# Patient Record
Sex: Female | Born: 1995 | Race: White | Hispanic: No | Marital: Single | Smoking: Never smoker
Health system: Southern US, Community
[De-identification: ages and names within clinical notes are randomized; demographics above are authoritative.]

## PROBLEM LIST (undated history)

## (undated) DIAGNOSIS — F909 Attention-deficit hyperactivity disorder, unspecified type: Secondary | ICD-10-CM

## (undated) HISTORY — PX: NO PAST SURGERIES: SHX2092

## (undated) HISTORY — DX: Attention-deficit hyperactivity disorder, unspecified type: F90.9

---
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
## 2017-03-31 ENCOUNTER — Encounter: Payer: Self-pay | Admitting: Family Medicine

## 2017-03-31 ENCOUNTER — Ambulatory Visit (INDEPENDENT_AMBULATORY_CARE_PROVIDER_SITE_OTHER): Payer: PRIVATE HEALTH INSURANCE | Admitting: Family Medicine

## 2017-03-31 DIAGNOSIS — F909 Attention-deficit hyperactivity disorder, unspecified type: Secondary | ICD-10-CM | POA: Diagnosis not present

## 2017-03-31 MED ORDER — AMPHETAMINE-DEXTROAMPHET ER 15 MG PO CP24: 15.0000 mg | ORAL_CAPSULE | ORAL | 0 refills | Status: DC

## 2017-03-31 MED ORDER — AMPHETAMINE-DEXTROAMPHETAMINE 5 MG PO TABS: 5.0000 mg | ORAL_TABLET | Freq: Every day | ORAL | 0 refills | Status: DC

## 2017-03-31 NOTE — Assessment & Plan Note (Signed)
Stable. Krista Mcdonald database reviewed. Refilled Adderall today.

## 2017-03-31 NOTE — Progress Notes (Signed)
   Subjective:  Patient ID: Krista Mcdonald Mcdonald, female    DOB: 09/26/1995  Age: 21 y.o. MRN: [id number]  CC: ADHD  HPI:  21 year old female with ADHD presents for follow-up regarding this.  ADHD  Doing well on Adderall (uses intermittently).  In see database reviewed. No discrepancies.  She has done well in school this past semester.  She is in need of refill today.  States that her Adderall has been working well. No side effects. No weight loss. Normal appetite.  Social Hx   Social History   Social History  . Marital status: Single    Spouse name: N/A  . Number of children: N/A  . Years of education: N/A   Social History Main Topics  . Smoking status: Never Smoker  . Smokeless tobacco: Never Used  . Alcohol use No  . Drug use: No  . Sexual activity: No   Other Topics Concern  . None   Social History Narrative   12th grade [employer]     Review of Systems  Constitutional: Negative.   Psychiatric/Behavioral: Positive for decreased concentration.   Objective:  BP 114/80   Pulse 83   Temp 98.3 F (36.8 C) (Oral)   Wt 105 lb 12.8 oz (48 kg)   LMP 03/28/2017 (Approximate)   SpO2 98%   BMI 18.16 kg/m   BP/Weight 03/31/2017 08/04/2016 03/26/2016  Systolic BP 114 94 108  Diastolic BP 80 61 62  Wt. (Lbs) 105.8 107 106.5  BMI 18.16 18.37 18.57   Physical Exam  Constitutional: She is oriented to person, place, and time. She appears well-developed. No distress.  Cardiovascular: Normal rate and regular rhythm.   Pulmonary/Chest: Effort normal. She has no wheezes. She has no rales.  Neurological: She is alert and oriented to person, place, and time.  Psychiatric: She has a normal mood and affect.  Vitals reviewed.  Assessment & Plan:   Problem List Items Addressed This Visit      Other   Attention deficit hyperactivity disorder (ADHD)    Stable. [name] database reviewed. Refilled Adderall today.         Meds ordered this encounter  Medications    . amphetamine-dextroamphetamine (ADDERALL XR) 15 MG 24 hr capsule    Sig: Take 1 capsule by mouth every morning.    Dispense:  90 capsule    Refill:  0  . amphetamine-dextroamphetamine (ADDERALL) 5 MG tablet    Sig: Take 1 tablet (5 mg total) by mouth daily.    Dispense:  90 tablet    Refill:  0     Follow-up: PRN  Krista Mcdonald OtherJayce [name] DO [facility] [place]

## 2017-03-31 NOTE — Patient Instructions (Signed)
Continue your meds.  Call for refills.   Take care  Dr. Adriana Simasook

## 2017-04-22 ENCOUNTER — Telehealth: Payer: Self-pay | Admitting: Family Medicine

## 2017-05-12 ENCOUNTER — Telehealth: Payer: Self-pay | Admitting: Family Medicine

## 2017-05-12 NOTE — Telephone Encounter (Signed)
Patient and patients mom have been notified that immunization records have been placed up front for pick up.

## 2017-05-12 NOTE — Telephone Encounter (Signed)
Pt called wanting to get a copy of her Immunization record. Thank you!  Call pt @ [phone]. Thank you!

## 2017-05-17 ENCOUNTER — Ambulatory Visit: Payer: PRIVATE HEALTH INSURANCE

## 2017-06-21 ENCOUNTER — Telehealth: Payer: Self-pay | Admitting: *Deleted

## 2017-06-21 MED ORDER — AMPHETAMINE-DEXTROAMPHETAMINE 5 MG PO TABS: 5.0000 mg | ORAL_TABLET | Freq: Every day | ORAL | 0 refills | Status: DC

## 2017-06-21 NOTE — Telephone Encounter (Signed)
Please advise 

## 2017-06-21 NOTE — Telephone Encounter (Signed)
vm full.

## 2017-06-21 NOTE — Telephone Encounter (Signed)
Pt has requested to have a medication refill Adderall  Pt requested to Establish with Dr.Sonnenberg  Pt contact [phone]

## 2017-06-21 NOTE — Telephone Encounter (Signed)
It appears the patient has 2 Adderall prescriptions. Please see what her regimen is and then I will refill.

## 2017-06-21 NOTE — Telephone Encounter (Signed)
Left message to return call 

## 2017-06-21 NOTE — Telephone Encounter (Signed)
Refilled. Patient needs an appointment to establish care.

## 2017-06-21 NOTE — Telephone Encounter (Signed)
Pt called back and stated that she does use both doses. Pt states that she just needs to 5 mg refilled. Please advise, thank you!

## 2017-06-22 NOTE — Telephone Encounter (Signed)
Patient scheduled.

## 2017-06-30 ENCOUNTER — Encounter: Payer: Self-pay | Admitting: Family Medicine

## 2017-06-30 ENCOUNTER — Ambulatory Visit (INDEPENDENT_AMBULATORY_CARE_PROVIDER_SITE_OTHER): Payer: PRIVATE HEALTH INSURANCE | Admitting: Family Medicine

## 2017-06-30 DIAGNOSIS — F909 Attention-deficit hyperactivity disorder, unspecified type: Secondary | ICD-10-CM | POA: Diagnosis not present

## 2017-06-30 DIAGNOSIS — N946 Dysmenorrhea, unspecified: Secondary | ICD-10-CM | POA: Diagnosis not present

## 2017-06-30 MED ORDER — AMPHETAMINE-DEXTROAMPHET ER 15 MG PO CP24
15.0000 mg | ORAL_CAPSULE | ORAL | 0 refills | Status: DC
Start: 2017-06-30 — End: 2018-02-21

## 2017-06-30 MED ORDER — LEVONORGEST-ETH ESTRAD 91-DAY 0.15-0.03 MG PO TABS: 1.0000 | ORAL_TABLET | Freq: Every day | ORAL | 4 refills | Status: DC

## 2017-06-30 MED ORDER — AMPHETAMINE-DEXTROAMPHETAMINE 5 MG PO TABS: 5.0000 mg | ORAL_TABLET | Freq: Every day | ORAL | 0 refills | Status: DC

## 2017-06-30 NOTE — Patient Instructions (Signed)
Nice to meet you. We will start you on continuous birth control. You should start this on the first Sunday after your menstrual cycle starts.

## 2017-06-30 NOTE — Assessment & Plan Note (Signed)
Patient's history consistent with dysmenorrhea. Cramping only occurs when she is on her menstrual cycle. No other abdominal pain. No other symptoms. Discussed options for treatment including ibuprofen which she has already tried with only mild benefit. Discussed oral contraceptives, Depo-Provera, Implanon, and IUD. Patient opted for oral contraceptives. Discussed monthly versus continuous use. Patient opted for continuous use. Prescription as outlined below. If not improving she will let us know. I did discuss obtaining a urine pregnancy test though she deferred given that she is on her menstrual cycle currently.

## 2017-06-30 NOTE — Assessment & Plan Note (Signed)
Symptoms fairly well controlled with current regimen. Refills will be given.

## 2017-06-30 NOTE — Progress Notes (Signed)
Krista AlarEric Drago Hammonds, MD Phone: [phone]  [place] Nation[place]and[place] M Mcdonald is a 21 y.o. female who presents today for follow-up.  ADHD: Taking Adderall XR 15 mg daily. Also taking 5-10 mg of immediate release at night to help with studying. [place] [employer] [place] herself. Has trouble focusing when she is at her sorority house. No appetite changes. No weight changes. No palpitations.  She wants to talk about options for primary dysmenorrhea. She notes for some time now she has had fairly significant cramps with menstruation. She does have a menstrual cycle once monthly. Lasts for 5 days. Currently on her menstrual cycle. She notes no urinary symptoms. No discharge. The cramps occur midline suprapubically. Advil does help sometimes though not all the time. She denies sexual activity. Does not plan to become sexually active anytime soon. Nonsmoker. No personal or family history of blood clot. No personal or family history of clotting disorder.  PMH: nonsmoker.   ROS see history of present illness  Objective  Physical Exam Vitals:   06/30/17 0829  BP: 90/60  Pulse: 74  Resp: 16  Temp: 98.4 F (36.9 C)  SpO2: 99%    BP Readings from Last 3 Encounters:  06/30/17 90/60  03/31/17 114/80  08/04/16 94/61   Wt Readings from Last 3 Encounters:  06/30/17 103 lb 8 oz (46.9 kg) (7 %, Z= -1.51)*  03/31/17 105 lb 12.8 oz (48 kg) (10 %, Z= -1.31)*  08/04/16 107 lb (48.5 kg) (13 %, Z= -1.14)*   * Growth percentiles are based on CDC 2-20 Years data.    Physical Exam  Constitutional: No distress.  Cardiovascular: Normal rate, regular rhythm and normal heart sounds.   Pulmonary/Chest: Effort normal and breath sounds normal.  Abdominal: Soft. Bowel sounds are normal. She exhibits no distension. There is no tenderness. There is no rebound and no guarding.  Skin: Skin is warm and dry. She is not diaphoretic.     Assessment/Plan: Please see individual problem list.  Attention deficit hyperactivity  disorder (ADHD) Symptoms fairly well controlled with current regimen. Refills will be given.  Dysmenorrhea Patient's history consistent with dysmenorrhea. Cramping only occurs when she is on her menstrual cycle. No other abdominal pain. No other symptoms. Discussed options for treatment including ibuprofen which she has already tried with only mild benefit. Discussed oral contraceptives, Depo-Provera, Implanon, and IUD. Patient opted for oral contraceptives. Discussed monthly versus continuous use. Patient opted for continuous use. Prescription as outlined below. If not improving she will let us know. I did discuss obtaining a urine pregnancy test though she deferred given that she is on her menstrual cycle currently.   No orders of the defined types were placed in this encounter.   Meds ordered this encounter  Medications  . levonorgestrel-ethinyl estradiol (SEASONALE,INTROVALE,JOLESSA) 0.15-0.03 MG tablet    Sig: Take 1 tablet by mouth daily.    Dispense:  1 Package    Refill:  4  . amphetamine-dextroamphetamine (ADDERALL XR) 15 MG 24 hr capsule    Sig: Take 1 capsule by mouth every morning.    Dispense:  90 capsule    Refill:  0  . amphetamine-dextroamphetamine (ADDERALL) 5 MG tablet    Sig: Take 1-2 tablets (5-10 mg total) by mouth daily.    Dispense:  90 tablet    Refill:  0  . amphetamine-dextroamphetamine (ADDERALL) 5 MG tablet    Sig: Take 1-2 tablets (5-10 mg total) by mouth daily. Do not fill until 07/31/17    Dispense:  60 tablet    Refill:  0  . amphetamine-dextroamphetamine (ADDERALL) 5 MG tablet    Sig: Take 1-2 tablets (5-10 mg total) by mouth daily. Do not fill until 08/30/17    Dispense:  60 tablet    Refill:  0    Krista Alar, MD [facility]

## 2017-07-01 ENCOUNTER — Ambulatory Visit: Payer: PRIVATE HEALTH INSURANCE | Admitting: Family Medicine

## 2017-07-15 ENCOUNTER — Telehealth: Payer: Self-pay | Admitting: Family Medicine

## 2017-07-28 ENCOUNTER — Ambulatory Visit: Payer: PRIVATE HEALTH INSURANCE | Admitting: Internal Medicine

## 2018-02-21 ENCOUNTER — Other Ambulatory Visit: Payer: Self-pay

## 2018-02-21 MED ORDER — AMPHETAMINE-DEXTROAMPHET ER 15 MG PO CP24: 15.0000 mg | ORAL_CAPSULE | ORAL | 0 refills | Status: DC

## 2018-02-21 NOTE — Telephone Encounter (Signed)
Sent to pharmacy 

## 2018-02-21 NOTE — Telephone Encounter (Signed)
Last OV 06/30/17 last filled 06/30/17 90 0rf

## 2018-02-21 NOTE — Telephone Encounter (Signed)
She needs an appointment scheduled to follow-up to receive refills.

## 2018-02-21 NOTE — Addendum Note (Signed)
Addended by: Inetta FermoHENDRICKS, JESSICA S on: 02/21/2018 09:24 AM   Modules accepted: Orders

## 2018-02-21 NOTE — Telephone Encounter (Signed)
Copied from CRM [phone]. Topic: Quick Communication - Rx Refill/Question >> Feb 21, 2018  8:54 AM Percival SpanishKennedy, Cheryl W wrote: Medication amphetamine-dextroamphetamine (ADDERALL XR) 15 MG 24 hr capsule  Has the patient contacted their pharmacy  no   Preferred Pharmacy   [facility]   Agent: Please be advised that RX refills may take up to 3 business days. We ask that you follow-up with your pharmacy.

## 2018-02-21 NOTE — Telephone Encounter (Signed)
Patient is scheduled   

## 2018-02-21 NOTE — Addendum Note (Signed)
Addended by: Glori LuisSONNENBERG, Orlene Salmons G on: 02/21/2018 05:04 PM   Modules accepted: Orders

## 2018-02-24 ENCOUNTER — Encounter: Payer: Self-pay | Admitting: Family Medicine

## 2018-02-24 ENCOUNTER — Ambulatory Visit (INDEPENDENT_AMBULATORY_CARE_PROVIDER_SITE_OTHER): Payer: PRIVATE HEALTH INSURANCE | Admitting: Family Medicine

## 2018-02-24 DIAGNOSIS — F909 Attention-deficit hyperactivity disorder, unspecified type: Secondary | ICD-10-CM

## 2018-02-24 DIAGNOSIS — N946 Dysmenorrhea, unspecified: Secondary | ICD-10-CM

## 2018-02-24 MED ORDER — AMPHETAMINE-DEXTROAMPHET ER 15 MG PO CP24: 15.0000 mg | ORAL_CAPSULE | ORAL | 0 refills | Status: DC

## 2018-02-24 NOTE — Progress Notes (Signed)
  Krista AlarEric Sonnenberg, MD Phone: [phone]   Krista Mcdonald is a 22 y.o. female who presents today for f/u.  CC: adhd  ADHD Medication: adderall xr in am, adderal in pm for studying Effectiveness: it works well Palpitations: no Sleep difficulty: no Appetite suppression: no  Patient reports her dysmenorrhea improved significantly with her oral contraceptive pill.  She has a menstrual cycle once every 3 months given that she takes it continuously.   Social History   Tobacco Use  Smoking Status Never Smoker  Smokeless Tobacco Never Used     ROS see history of present illness  Objective  Physical Exam Vitals:   02/24/18 1537  BP: 102/80  Pulse: 87  Temp: 98.5 F (36.9 C)  SpO2: 98%    BP Readings from Last 3 Encounters:  02/24/18 102/80  06/30/17 90/60  03/31/17 114/80   Wt Readings from Last 3 Encounters:  02/24/18 102 lb 9.6 oz (46.5 kg)  06/30/17 103 lb 8 oz (46.9 kg) (7 %, Z= -1.51)*  03/31/17 105 lb 12.8 oz (48 kg) (10 %, Z= -1.31)*   * Growth percentiles are based on CDC (Girls, 2-20 Years) data.    Physical Exam  Constitutional: No distress.  Cardiovascular: Normal rate, regular rhythm and normal heart sounds.  Pulmonary/Chest: Effort normal and breath sounds normal.  Neurological: She is alert.  Skin: Skin is warm and dry. She is not diaphoretic.     Assessment/Plan: Please see individual problem list.  Dysmenorrhea Improved with OCP.  She will continue this.  Attention deficit hyperactivity disorder (ADHD) Symptoms well controlled.  Refill of extended release Adderall given.  Plan to refill immediate release Adderall when she is back in school.  No orders of the defined types were placed in this encounter.   Meds ordered this encounter  Medications  . amphetamine-dextroamphetamine (ADDERALL XR) 15 MG 24 hr capsule    Sig: Take 1 capsule by mouth every morning.    Dispense:  90 capsule    Refill:  0     Krista AlarEric Sonnenberg, MD [facility] [place]

## 2018-02-24 NOTE — Assessment & Plan Note (Signed)
Symptoms well controlled.  Refill of extended release Adderall given.  Plan to refill immediate release Adderall when she is back in school.

## 2018-02-24 NOTE — Patient Instructions (Signed)
Nice to see you. I refilled your Adderall.  We will see you back in 6 months.

## 2018-02-24 NOTE — Assessment & Plan Note (Signed)
Improved with OCP.  She will continue this.

## 2018-04-19 DIAGNOSIS — F419 Anxiety disorder, unspecified: Secondary | ICD-10-CM | POA: Diagnosis not present

## 2018-05-29 DIAGNOSIS — L659 Nonscarring hair loss, unspecified: Secondary | ICD-10-CM | POA: Diagnosis not present

## 2018-06-01 DIAGNOSIS — L638 Other alopecia areata: Secondary | ICD-10-CM | POA: Diagnosis not present

## 2018-06-13 DIAGNOSIS — L639 Alopecia areata, unspecified: Secondary | ICD-10-CM | POA: Diagnosis not present

## 2018-06-16 ENCOUNTER — Telehealth: Payer: Self-pay

## 2018-06-16 NOTE — Telephone Encounter (Signed)
Copied from CRM [id number]. Topic: General - Other >> Jun 16, 2018  4:17 PM Elliot Gault wrote: Relation to pt: self  Call back number: [phone]   Reason for call:  Patient would like to schedule a medication follow up appointment to discuss her adderral and birth control. PCP next available appointment is not until 09/27/2018,  patient would like to be seen sooner, please advise patient if possible

## 2018-06-19 NOTE — Telephone Encounter (Signed)
Sent to PCP are you willing to see patient sooner?

## 2018-06-20 NOTE — Telephone Encounter (Signed)
Its okay to schedule her at 430 on a Monday, Wednesday, or Friday.  Thanks.

## 2018-06-21 NOTE — Telephone Encounter (Signed)
Sent to [place] please schedule patient.   Thanks

## 2018-06-23 NOTE — Telephone Encounter (Signed)
I spoke with pt. Pt is scheduled on 06/30/2018 @ 4:30pm.

## 2018-06-26 DIAGNOSIS — L738 Other specified follicular disorders: Secondary | ICD-10-CM | POA: Diagnosis not present

## 2018-06-26 DIAGNOSIS — L638 Other alopecia areata: Secondary | ICD-10-CM | POA: Diagnosis not present

## 2018-06-26 DIAGNOSIS — L700 Acne vulgaris: Secondary | ICD-10-CM | POA: Diagnosis not present

## 2018-06-26 DIAGNOSIS — L710 Perioral dermatitis: Secondary | ICD-10-CM | POA: Diagnosis not present

## 2018-06-30 ENCOUNTER — Ambulatory Visit (INDEPENDENT_AMBULATORY_CARE_PROVIDER_SITE_OTHER): Payer: BLUE CROSS/BLUE SHIELD | Admitting: Family Medicine

## 2018-06-30 ENCOUNTER — Encounter: Payer: Self-pay | Admitting: Family Medicine

## 2018-06-30 DIAGNOSIS — F909 Attention-deficit hyperactivity disorder, unspecified type: Secondary | ICD-10-CM | POA: Diagnosis not present

## 2018-06-30 DIAGNOSIS — N946 Dysmenorrhea, unspecified: Secondary | ICD-10-CM

## 2018-06-30 DIAGNOSIS — L659 Nonscarring hair loss, unspecified: Secondary | ICD-10-CM | POA: Diagnosis not present

## 2018-06-30 MED ORDER — AMPHETAMINE-DEXTROAMPHET ER 20 MG PO CP24: 20.0000 mg | ORAL_CAPSULE | ORAL | 0 refills | Status: DC

## 2018-06-30 MED ORDER — AMPHETAMINE-DEXTROAMPHETAMINE 5 MG PO TABS: 5.0000 mg | ORAL_TABLET | Freq: Every day | ORAL | 0 refills | Status: DC

## 2018-06-30 MED ORDER — LEVONORGEST-ETH ESTRAD 91-DAY 0.15-0.03 MG PO TABS: 1.0000 | ORAL_TABLET | Freq: Every day | ORAL | 4 refills | Status: DC

## 2018-06-30 NOTE — Progress Notes (Signed)
  Marikay Alar, MD Phone: [phone]  Krista Mcdonald is a 22 y.o. female who presents today for f/u.  CC: ADHD, dysmenorrhea, alopecia  ADHD: Taking Adderall XR 15 mg daily.  Notes is not as effective as it used to be.  She is not able to focus as well.  She has been taking some of her Adderall immediate release 5 mg and that does help though it does keep her awake at night when she takes it later in the day.  No appetite suppression or palpitations.  Dysmenorrhea: She notes menses once every 3 months lasting for 3 days.  She is on continuous OCPs.  She notes minimal cramping.  Her symptoms have improved significantly.  She is not sexually active.  Alopecia: Recently treated by dermatology for this.  It is on her right posterior scalp.  She got cortisone shots.  Also treated with prednisone.  Social History   Tobacco Use  Smoking Status Never Smoker  Smokeless Tobacco Never Used     ROS see history of present illness  Objective  Physical Exam Vitals:   06/30/18 1640  BP: 122/60  Pulse: 96  Temp: 98.2 F (36.8 C)  SpO2: 99%    BP Readings from Last 3 Encounters:  06/30/18 122/60  02/24/18 102/80  06/30/17 90/60   Wt Readings from Last 3 Encounters:  06/30/18 102 lb 3.2 oz (46.4 kg)  02/24/18 102 lb 9.6 oz (46.5 kg)  06/30/17 103 lb 8 oz (46.9 kg) (7 %, Z= -1.51)*   * Growth percentiles are based on CDC (Girls, 2-20 Years) data.    Physical Exam  Constitutional: No distress.  Cardiovascular: Normal rate, regular rhythm and normal heart sounds.  Pulmonary/Chest: Effort normal and breath sounds normal.  Musculoskeletal: She exhibits no edema.  Neurological: She is alert.  Skin: Skin is warm and dry. She is not diaphoretic.  Alopecia noted right posterior scalp near her hairline     Assessment/Plan: Please see individual problem list.  Dysmenorrhea Remains well controlled.  She will continue with her current regimen.  Discussed condom use if she were  to become sexually active.  Attention deficit hyperactivity disorder (ADHD) Adderall XR 15 mg has not been as beneficial as previously.  We will trial Adderall XR 20 mg daily.  She will return in 1 month.  Refill of Adderall 5 mg to take as needed later in the day.  If she develops sleep changes, appetite suppression, or palpitations with the increased dose of Adderall she will contact us.  Alopecia Patient will continue to follow with dermatology.   No orders of the defined types were placed in this encounter.   Meds ordered this encounter  Medications  . amphetamine-dextroamphetamine (ADDERALL XR) 20 MG 24 hr capsule    Sig: Take 1 capsule (20 mg total) by mouth every morning.    Dispense:  30 capsule    Refill:  0  . amphetamine-dextroamphetamine (ADDERALL) 5 MG tablet    Sig: Take 1 tablet (5 mg total) by mouth daily.    Dispense:  30 tablet    Refill:  0  . levonorgestrel-ethinyl estradiol (SEASONALE,INTROVALE,JOLESSA) 0.15-0.03 MG tablet    Sig: Take 1 tablet by mouth daily.    Dispense:  1 Package    Refill:  4     Marikay Alar, MD [facility]

## 2018-06-30 NOTE — Patient Instructions (Signed)
Nice to see you. We will try increasing her Adderall dose to 20 mg daily.  If you notice any appetite suppression or increased heart rate or palpitations with this please contact us.

## 2018-07-01 DIAGNOSIS — L659 Nonscarring hair loss, unspecified: Secondary | ICD-10-CM | POA: Insufficient documentation

## 2018-07-01 NOTE — Assessment & Plan Note (Signed)
Adderall XR 15 mg has not been as beneficial as previously.  We will trial Adderall XR 20 mg daily.  She will return in 1 month.  Refill of Adderall 5 mg to take as needed later in the day.  If she develops sleep changes, appetite suppression, or palpitations with the increased dose of Adderall she will contact us.

## 2018-07-01 NOTE — Assessment & Plan Note (Signed)
Patient will continue to follow with dermatology.

## 2018-07-01 NOTE — Assessment & Plan Note (Signed)
Remains well controlled.  She will continue with her current regimen.  Discussed condom use if she were to become sexually active.

## 2018-07-14 DIAGNOSIS — L639 Alopecia areata, unspecified: Secondary | ICD-10-CM | POA: Diagnosis not present

## 2018-07-14 DIAGNOSIS — Z79899 Other long term (current) drug therapy: Secondary | ICD-10-CM | POA: Diagnosis not present

## 2018-07-14 DIAGNOSIS — R52 Pain, unspecified: Secondary | ICD-10-CM | POA: Diagnosis not present

## 2018-07-28 DIAGNOSIS — L639 Alopecia areata, unspecified: Secondary | ICD-10-CM | POA: Diagnosis not present

## 2018-07-28 DIAGNOSIS — Z79899 Other long term (current) drug therapy: Secondary | ICD-10-CM | POA: Diagnosis not present

## 2018-08-04 ENCOUNTER — Ambulatory Visit: Payer: BLUE CROSS/BLUE SHIELD | Admitting: Family Medicine

## 2018-08-04 ENCOUNTER — Telehealth: Payer: Self-pay | Admitting: Family Medicine

## 2018-08-04 NOTE — Telephone Encounter (Signed)
LMTCB to see if we could get patient scheduled at any of the available times.

## 2018-08-04 NOTE — Telephone Encounter (Signed)
Copied from CRM [id number]. Topic: Appointment Scheduling - Scheduling Inquiry for Clinic >> Aug 04, 2018 11:38 AM Crist InfanteHarrald, Kathy J wrote: Reason for CRM: pt had to cancel appt because she states she has to go into work.  Advised I would send a message for another appt with Dr Antony HasteSonnenburg, as this is an ADHD appt and not sure if OK to schedule with another provider. Please call back

## 2018-08-04 NOTE — Telephone Encounter (Signed)
She is okay to schedule at 430 on a Monday or Wednesday.

## 2018-08-04 NOTE — Telephone Encounter (Signed)
Sent to PCP as an FYI and advise when would be a good day or time to see patient sooner for the ADHD follow up appt. Thanks

## 2018-08-08 NOTE — Telephone Encounter (Signed)
Called patient and left a VM to call back.  

## 2018-08-09 NOTE — Telephone Encounter (Signed)
Called and spoke with patient. Pt advised has already scheduled an appt for 12/4/ @ 4:30

## 2018-08-16 ENCOUNTER — Encounter: Payer: Self-pay | Admitting: Family Medicine

## 2018-08-16 ENCOUNTER — Ambulatory Visit (INDEPENDENT_AMBULATORY_CARE_PROVIDER_SITE_OTHER): Payer: BLUE CROSS/BLUE SHIELD | Admitting: Family Medicine

## 2018-08-16 DIAGNOSIS — F909 Attention-deficit hyperactivity disorder, unspecified type: Secondary | ICD-10-CM

## 2018-08-16 DIAGNOSIS — L659 Nonscarring hair loss, unspecified: Secondary | ICD-10-CM

## 2018-08-16 MED ORDER — AMPHETAMINE-DEXTROAMPHET ER 20 MG PO CP24: 20.0000 mg | ORAL_CAPSULE | Freq: Every day | ORAL | 0 refills | Status: DC

## 2018-08-16 MED ORDER — AMPHETAMINE-DEXTROAMPHETAMINE 5 MG PO TABS: 5.0000 mg | ORAL_TABLET | Freq: Every day | ORAL | 0 refills | Status: DC

## 2018-08-16 MED ORDER — AMPHETAMINE-DEXTROAMPHET ER 20 MG PO CP24: 20.0000 mg | ORAL_CAPSULE | ORAL | 0 refills | Status: DC

## 2018-08-16 NOTE — Patient Instructions (Signed)
Nice to see you. I have refilled your Adderall. Please contact your dermatologist and let them know about the burning sensation in your hands and feet with the cyclosporine.

## 2018-08-16 NOTE — Assessment & Plan Note (Signed)
Patient has done well on the increased dose of Adderall extended release.  Will provide refills.  Controlled substance database reviewed.

## 2018-08-16 NOTE — Assessment & Plan Note (Signed)
Advised to contact her dermatologist to discuss the symptoms she reported relating to her cyclosporine.

## 2018-08-16 NOTE — Progress Notes (Signed)
  Krista AlarEric Maekayla Giorgio, MD Phone: [phone]  Krista NationChandler M Mcdonald is a 22 y.o. female who presents today for follow-up.  CC: ADHD, alopecia  ADHD: Patient notes the Adderall XR 20 mg daily has been beneficial.  She did feel little tired after starting it at first though that has improved.  She continues on immediate release Adderall later in the day.  She has no sleeping issues unless she takes the immediate release Adderall after 4 PM.  No appetite suppression or palpitations.  Alopecia: She reports this is improving.  Her dermatologist has her on cyclosporine.  She notes since going on that she has had a burning sensation in her hands and feet when she goes outside and then comes back inside.  She notes her face will feel a little hot and red as well.  She has not discussed this with dermatology yet.  Social History   Tobacco Use  Smoking Status Never Smoker  Smokeless Tobacco Never Used     ROS see history of present illness  Objective  Physical Exam Vitals:   08/16/18 1627  BP: 118/78  Pulse: 100  Temp: 98.2 F (36.8 C)  SpO2: 98%    BP Readings from Last 3 Encounters:  08/16/18 118/78  06/30/18 122/60  02/24/18 102/80   Wt Readings from Last 3 Encounters:  08/16/18 103 lb 9.6 oz (47 kg)  06/30/18 102 lb 3.2 oz (46.4 kg)  02/24/18 102 lb 9.6 oz (46.5 kg)    Physical Exam  Constitutional: No distress.  Cardiovascular: Normal rate, regular rhythm and normal heart sounds.  Pulmonary/Chest: Effort normal and breath sounds normal.  Musculoskeletal: She exhibits no edema.  Neurological: She is alert.  Skin: Skin is warm and dry. She is not diaphoretic.     Assessment/Plan: Please see individual problem list.  Alopecia Advised to contact her dermatologist to discuss the symptoms she reported relating to her cyclosporine.  Attention deficit hyperactivity disorder (ADHD) Patient has done well on the increased dose of Adderall extended release.  Will provide refills.   Controlled substance database reviewed.   No orders of the defined types were placed in this encounter.   Meds ordered this encounter  Medications  . amphetamine-dextroamphetamine (ADDERALL XR) 20 MG 24 hr capsule    Sig: Take 1 capsule (20 mg total) by mouth daily.    Dispense:  30 capsule    Refill:  0  . amphetamine-dextroamphetamine (ADDERALL XR) 20 MG 24 hr capsule    Sig: Take 1 capsule (20 mg total) by mouth daily.    Dispense:  30 capsule    Refill:  0  . amphetamine-dextroamphetamine (ADDERALL XR) 20 MG 24 hr capsule    Sig: Take 1 capsule (20 mg total) by mouth every morning.    Dispense:  30 capsule    Refill:  0  . amphetamine-dextroamphetamine (ADDERALL) 5 MG tablet    Sig: Take 1-2 tablets (5-10 mg total) by mouth daily.    Dispense:  60 tablet    Refill:  0  . amphetamine-dextroamphetamine (ADDERALL) 5 MG tablet    Sig: Take 1-2 tablets (5-10 mg total) by mouth daily.    Dispense:  60 tablet    Refill:  0  . amphetamine-dextroamphetamine (ADDERALL) 5 MG tablet    Sig: Take 1-2 tablets (5-10 mg total) by mouth daily.    Dispense:  60 tablet    Refill:  0     Krista AlarEric Brax Walen, MD [facility] [place]

## 2018-08-28 ENCOUNTER — Ambulatory Visit: Payer: PRIVATE HEALTH INSURANCE | Admitting: Family Medicine

## 2018-09-01 DIAGNOSIS — L659 Nonscarring hair loss, unspecified: Secondary | ICD-10-CM | POA: Diagnosis not present

## 2018-09-27 DIAGNOSIS — F902 Attention-deficit hyperactivity disorder, combined type: Secondary | ICD-10-CM | POA: Diagnosis not present

## 2018-11-24 ENCOUNTER — Other Ambulatory Visit: Payer: Self-pay | Admitting: Family Medicine

## 2018-11-24 NOTE — Telephone Encounter (Signed)
Copied from CRM [id number]. Topic: Quick Communication - Rx Refill/Question >> Nov 24, 2018  8:37 AM Elliot Gault wrote: Medication: amphetamine-dextroamphetamine (ADDERALL XR) 20 MG 24 hr capsule   Has the patient contacted their pharmacy? Yes, patient was advised due to medication being controlled to call PCP office (patient scheduled for 03/07/2019 due to PCP availability)  Preferred Pharmacy (with phone number or street name):  [id number] - [place],  - [address] [phone] (Phone) [phone] (Fax)  Agent: Please be advised that RX refills may take up to 3 business days. We ask that you follow-up with your pharmacy.

## 2018-11-24 NOTE — Telephone Encounter (Signed)
Requested medication (s) are due for refill today -yes  Requested medication (s) are on the active medication list -yes  Future visit scheduled -yes  Last refill: 10/17/18  Notes to clinic: Patient is requesting refill of non delegated medication. Sent for PCP review  Requested Prescriptions  Pending Prescriptions Disp Refills   amphetamine-dextroamphetamine (ADDERALL XR) 20 MG 24 hr capsule 30 capsule 0    Sig: Take 1 capsule (20 mg total) by mouth daily.     Not Delegated - Psychiatry:  Stimulants/ADHD Failed - 11/24/2018  8:45 AM      Failed - This refill cannot be delegated      Failed - Urine Drug Screen completed in last 360 days.      Failed - Valid encounter within last 3 months    Recent Outpatient Visits          3 months ago Alopecia   [facility] [place], Yehuda Mao, MD   4 months ago Dysmenorrhea   [facility] [place], Yehuda Mao, MD   9 months ago Dysmenorrhea   [facility] Glori Luis, MD   1 year ago Attention deficit hyperactivity disorder (ADHD), unspecified ADHD type   [facility] Glori Luis, MD   1 year ago Attention deficit hyperactivity disorder (ADHD), unspecified ADHD type   [facility] [place], [place], [place]      Future Appointments            In 3 months Birdie Sons, Yehuda Mao, MD [facility] [place]            Requested Prescriptions  Pending Prescriptions Disp Refills   amphetamine-dextroamphetamine (ADDERALL XR) 20 MG 24 hr capsule 30 capsule 0    Sig: Take 1 capsule (20 mg total) by mouth daily.     Not Delegated - Psychiatry:  Stimulants/ADHD Failed - 11/24/2018  8:45 AM      Failed - This refill cannot be delegated      Failed - Urine Drug Screen completed in last 360 days.      Failed - Valid encounter within last 3 months    Recent Outpatient Visits          3 months ago Alopecia   [facility]  [place], Yehuda Mao, MD   4 months ago Dysmenorrhea   [facility] [place], Yehuda Mao, MD   9 months ago Dysmenorrhea   [facility] Glori Luis, MD   1 year ago Attention deficit hyperactivity disorder (ADHD), unspecified ADHD type   [facility] Glori Luis, MD   1 year ago Attention deficit hyperactivity disorder (ADHD), unspecified ADHD type   [facility] [place], Verdis [place], [place]      Future Appointments            In 3 months Birdie Sons, Yehuda Mao, MD [facility]

## 2018-11-28 NOTE — Telephone Encounter (Signed)
Last OV 08/16/2018   Last refilled;  Adderall  XR 20 MG 2/4/2020disp 30 no refills   Adderall 5 MG last refilled 10/17/2018 disp 60 with no refills   Next appt 03/07/2019

## 2018-11-29 MED ORDER — AMPHETAMINE-DEXTROAMPHETAMINE 5 MG PO TABS: 5.0000 mg | ORAL_TABLET | Freq: Every day | ORAL | 0 refills | Status: DC

## 2018-11-29 MED ORDER — AMPHETAMINE-DEXTROAMPHET ER 20 MG PO CP24: 20.0000 mg | ORAL_CAPSULE | Freq: Every day | ORAL | 0 refills | Status: DC

## 2018-11-29 NOTE — Telephone Encounter (Signed)
Controlled substance database reviewed. Sent to pharmacy.   

## 2019-01-26 NOTE — Telephone Encounter (Signed)
error 

## 2019-03-07 ENCOUNTER — Other Ambulatory Visit: Payer: Self-pay

## 2019-03-07 ENCOUNTER — Encounter: Payer: Self-pay | Admitting: Family Medicine

## 2019-03-07 ENCOUNTER — Ambulatory Visit (INDEPENDENT_AMBULATORY_CARE_PROVIDER_SITE_OTHER): Payer: BC Managed Care – PPO | Admitting: Family Medicine

## 2019-03-07 DIAGNOSIS — F909 Attention-deficit hyperactivity disorder, unspecified type: Secondary | ICD-10-CM | POA: Diagnosis not present

## 2019-03-07 DIAGNOSIS — L659 Nonscarring hair loss, unspecified: Secondary | ICD-10-CM

## 2019-03-07 DIAGNOSIS — N946 Dysmenorrhea, unspecified: Secondary | ICD-10-CM | POA: Diagnosis not present

## 2019-03-07 MED ORDER — AMPHETAMINE-DEXTROAMPHETAMINE 5 MG PO TABS: 5.0000 mg | ORAL_TABLET | Freq: Two times a day (BID) | ORAL | 0 refills | Status: DC

## 2019-03-07 NOTE — Assessment & Plan Note (Signed)
Much improved.  Monitor for recurrence. 

## 2019-03-07 NOTE — Assessment & Plan Note (Signed)
She has had no additional issues with dysmenorrhea though she has come off of her OCP.  I discussed that irregular menstrual cycles are somewhat common after stopping contraceptive pill.  She will monitor and if not becoming more regular in the near future she will let us know.  Discussed that if she would like to go back on her OCP in the future she can let us know and we can send this in for her.  Advised that if she becomes sexually active she should use condoms in addition to the OCP as the OCP does not help prevent STDs.

## 2019-03-07 NOTE — Progress Notes (Signed)
Virtual Visit via telephone Note  This visit type was conducted due to national recommendations for restrictions regarding the COVID-19 pandemic (e.g. social distancing).  This format is felt to be most appropriate for this patient at this time.  All issues noted in this document were discussed and addressed.  No physical exam was performed (except for noted visual exam findings with Video Visits).   I connected with Krista Mcdonald today at  2:45 PM EDT by telephone and verified that I am speaking with the correct person using two identifiers. Location patient: home Location provider: work Persons participating in the virtual visit: patient, provider, mom  I discussed the limitations, risks, security and privacy concerns of performing an evaluation and management service by telephone and the availability of in person appointments. I also discussed with the patient that there may be a patient responsible charge related to this service. The patient expressed understanding and agreed to proceed.  Interactive audio and video telecommunications were attempted between this provider and patient, however failed, due to patient having technical difficulties OR patient did not have access to video capability.  We continued and completed visit with audio only.   Reason for visit: Follow-up.  HPI: ADHD: Taking Adderall.  She notes she stopped the extended release Adderall as it was making her feel sleepy later in the day.  She went on to the immediate release Adderall taking 10-15 mg in the morning and 5 mg in the evening to study and this worked quite well for her.  No appetite suppression.  No sleep changes.  No palpitations.  Dysmenorrhea: Patient notes she came off of her oral contraceptive pill when she had issues with alopecia.  She notes she was advised that the oral contraceptive pill did not cause the alopecia though it was possibly contributing to her hair taking a long time to grow back.  She  discontinued the OCP and her hair grew back quite quickly.  She notes no additional dysmenorrhea.  She notes her menstrual cycles have been a little irregular since coming off of the OCP as she can go a couple of months between cycles.  LMP was last month.  She notes she has not sexually active.  Alopecia: She notes this has improved significantly.  Improved after discontinuing her OCP.   ROS: See pertinent positives and negatives per HPI.  Past Medical History:  Diagnosis Date  . ADHD (attention deficit hyperactivity disorder)     Past Surgical History:  Procedure Laterality Date  . NO PAST SURGERIES      Family History  Problem Relation Age of Onset  . Throat cancer Maternal Grandfather     SOCIAL HX: Non-smoker.   Current Outpatient Medications:  .  ACZONE 7.5 % GEL, APPLY thin layer TO FACE daily, Disp: , Rfl: 11 .  [START ON 05/07/2019] amphetamine-dextroamphetamine (ADDERALL) 5 MG tablet, Take 1-2 tablets (5-10 mg total) by mouth 2 (two) times daily with a meal., Disp: 120 tablet, Rfl: 0 .  [START ON 04/06/2019] amphetamine-dextroamphetamine (ADDERALL) 5 MG tablet, Take 1-2 tablets (5-10 mg total) by mouth 2 (two) times daily with a meal., Disp: 120 tablet, Rfl: 0 .  amphetamine-dextroamphetamine (ADDERALL) 5 MG tablet, Take 1-2 tablets (5-10 mg total) by mouth 2 (two) times daily with a meal., Disp: 120 tablet, Rfl: 0 .  cycloSPORINE modified (NEORAL) 50 MG capsule, TAKE THREE CAPSULES BY MOUTH EVERY MORNING and TWO CAPSULES EVERY EVENING, Disp: , Rfl: 0 .  EPIDUO FORTE 0.3-2.5 % GEL,  apply ON THE SKIN AT bedtime; TO forehead AND glabella, Disp: , Rfl: 11 .  levonorgestrel-ethinyl estradiol (SEASONALE,INTROVALE,JOLESSA) 0.15-0.03 MG tablet, Take 1 tablet by mouth daily. (Patient not taking: Reported on 03/07/2019), Disp: 1 Package, Rfl: 4 .  tretinoin (RETIN-A) 0.05 % cream, APPLY PEA SIZE AMOUNT TO FACE AT BEDTIME, Disp: , Rfl: 10  EXAM: This is a telehealth telephone visit  and thus no physical exam was completed.  ASSESSMENT AND PLAN:  Discussed the following assessment and plan:  Alopecia Much improved.  Monitor for recurrence.  Dysmenorrhea She has had no additional issues with dysmenorrhea though she has come off of her OCP.  I discussed that irregular menstrual cycles are somewhat common after stopping contraceptive pill.  She will monitor and if not becoming more regular in the near future she will let us know.  Discussed that if she would like to go back on her OCP in the future she can let us know and we can send this in for her.  Advised that if she becomes sexually active she should use condoms in addition to the OCP as the OCP does not help prevent STDs.  Attention deficit hyperactivity disorder (ADHD) We will refill her immediate release Adderall and discontinue the extended release Adderall.  Controlled substance database reviewed.  Advised to contact us with any side effects symptoms.  Social distancing precautions and sick precautions given regarding COVID-19.   I discussed the assessment and treatment plan with the patient. The patient was provided an opportunity to ask questions and all were answered. The patient agreed with the plan and demonstrated an understanding of the instructions.   The patient was advised to call back or seek an in-person evaluation if the symptoms worsen or if the condition fails to improve as anticipated.  I provided 13 minutes of non-face-to-face time during this encounter.   Tommi Rumps, MD

## 2019-03-07 NOTE — Assessment & Plan Note (Signed)
We will refill her immediate release Adderall and discontinue the extended release Adderall.  Controlled substance database reviewed.  Advised to contact us with any side effects symptoms.

## 2019-04-12 ENCOUNTER — Telehealth: Payer: Self-pay

## 2019-04-12 NOTE — Telephone Encounter (Signed)
A  Fax approval for Adderall came for 5 mg tablets authorized for 4.0 tablets per day the reference number: [id number]. Sent to scan .  Nina,cma

## 2019-05-31 ENCOUNTER — Other Ambulatory Visit: Payer: Self-pay

## 2019-06-04 ENCOUNTER — Encounter: Payer: Self-pay | Admitting: Family Medicine

## 2019-06-04 ENCOUNTER — Ambulatory Visit (INDEPENDENT_AMBULATORY_CARE_PROVIDER_SITE_OTHER): Payer: BC Managed Care – PPO | Admitting: Family Medicine

## 2019-06-04 ENCOUNTER — Other Ambulatory Visit: Payer: Self-pay

## 2019-06-04 DIAGNOSIS — F909 Attention-deficit hyperactivity disorder, unspecified type: Secondary | ICD-10-CM | POA: Diagnosis not present

## 2019-06-04 DIAGNOSIS — L659 Nonscarring hair loss, unspecified: Secondary | ICD-10-CM

## 2019-06-04 DIAGNOSIS — Z3009 Encounter for other general counseling and advice on contraception: Secondary | ICD-10-CM

## 2019-06-04 DIAGNOSIS — Z309 Encounter for contraceptive management, unspecified: Secondary | ICD-10-CM | POA: Insufficient documentation

## 2019-06-04 MED ORDER — AMPHETAMINE-DEXTROAMPHETAMINE 15 MG PO TABS: 15.0000 mg | ORAL_TABLET | Freq: Every day | ORAL | 0 refills | Status: DC

## 2019-06-04 MED ORDER — AMPHETAMINE-DEXTROAMPHETAMINE 5 MG PO TABS: 5.0000 mg | ORAL_TABLET | Freq: Every day | ORAL | 0 refills | Status: DC

## 2019-06-04 NOTE — Assessment & Plan Note (Signed)
Adequately controlled.  We will change her prescriptions regarding the dosage of her morning pill and her afternoon pill.  She will continue on this regimen.

## 2019-06-04 NOTE — Assessment & Plan Note (Signed)
Improving.  She will continue to monitor. 

## 2019-06-04 NOTE — Assessment & Plan Note (Signed)
No longer on oral contraceptive pills.  Previously on this for dysmenorrhea.  She is not sexually active.  She wants to remain off of this currently given issues with alopecia.  I did discuss there are potentially other options that she could try though depending on her preference we may have to have her see a gynecologist in the future.

## 2019-06-04 NOTE — Progress Notes (Signed)
Virtual Visit via video Note  This visit type was conducted due to national recommendations for restrictions regarding the COVID-19 pandemic (e.g. social distancing).  This format is felt to be most appropriate for this patient at this time.  All issues noted in this document were discussed and addressed.  No physical exam was performed (except for noted visual exam findings with Video Visits).   I connected with Krista Mcdonald today at  9:30 AM EDT by a video enabled telemedicine application and verified that I am speaking with the correct person using two identifiers. Location patient: home Location provider: work  Persons participating in the virtual visit: patient, provider  I discussed the limitations, risks, security and privacy concerns of performing an evaluation and management service by telephone and the availability of in person appointments. I also discussed with the patient that there may be a patient responsible charge related to this service. The patient expressed understanding and agreed to proceed.  Reason for visit: follow-up  HPI: ADHD: Taking Adderall 15 mg in the morning and 5 mg in the afternoon.  She notes her pharmacy did not like how the prescription was written previously and shorted her a number of her pills.  No sleep issues, appetite suppression, or palpitations.  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  She came off of her oral contraceptive pill as that may have been preventing it from improving.  She wonders if the flu vaccine contributed and she would like a letter stating she does not have to take the flu vaccine this year.  Oral contraceptive use: Patient has discontinued this.  It was felt to be contributing to the alopecia areata not improving.  She notes she is not sexually active.   ROS: See pertinent positives and negatives per HPI.  Past Medical History:  Diagnosis Date  . ADHD (attention deficit hyperactivity disorder)     Past  Surgical History:  Procedure Laterality Date  . NO PAST SURGERIES      Family History  Problem Relation Age of Onset  . Throat cancer Maternal Grandfather     SOCIAL HX: Non-smoker.   Current Outpatient Medications:  .  ACZONE 7.5 % GEL, APPLY thin layer TO FACE daily, Disp: , Rfl: 11 .  [START ON 08/04/2019] amphetamine-dextroamphetamine (ADDERALL) 5 MG tablet, Take 1 tablet (5 mg total) by mouth daily after lunch., Disp: 30 tablet, Rfl: 0 .  [START ON 07/04/2019] amphetamine-dextroamphetamine (ADDERALL) 5 MG tablet, Take 1 tablet (5 mg total) by mouth daily after lunch., Disp: 30 tablet, Rfl: 0 .  amphetamine-dextroamphetamine (ADDERALL) 5 MG tablet, Take 1 tablet (5 mg total) by mouth daily after lunch., Disp: 30 tablet, Rfl: 0 .  cycloSPORINE modified (NEORAL) 50 MG capsule, TAKE THREE CAPSULES BY MOUTH EVERY MORNING and TWO CAPSULES EVERY EVENING, Disp: , Rfl: 0 .  EPIDUO FORTE 0.3-2.5 % GEL, apply ON THE SKIN AT bedtime; TO forehead AND glabella, Disp: , Rfl: 11 .  tretinoin (RETIN-A) 0.05 % cream, APPLY PEA SIZE AMOUNT TO FACE AT BEDTIME, Disp: , Rfl: 10 .  [START ON 08/04/2019] amphetamine-dextroamphetamine (ADDERALL) 15 MG tablet, Take 1 tablet by mouth daily after breakfast., Disp: 30 tablet, Rfl: 0 .  [START ON 07/04/2019] amphetamine-dextroamphetamine (ADDERALL) 15 MG tablet, Take 1 tablet by mouth daily after breakfast., Disp: 30 tablet, Rfl: 0 .  amphetamine-dextroamphetamine (ADDERALL) 15 MG tablet, Take 1 tablet by mouth daily after breakfast., Disp: 30 tablet, Rfl: 0  EXAM:  VITALS per patient if  applicable: None.  GENERAL: alert, oriented, appears well and in no acute distress  HEENT: atraumatic, conjunttiva clear, no obvious abnormalities on inspection of external nose and ears  NECK: normal movements of the head and neck  LUNGS: on inspection no signs of respiratory distress, breathing rate appears normal, no obvious gross SOB, gasping or wheezing  CV: no  obvious cyanosis  MS: moves all visible extremities without noticeable abnormality  PSYCH/NEURO: pleasant and cooperative, no obvious depression or anxiety, speech and thought processing grossly intact  ASSESSMENT AND PLAN:  Discussed the following assessment and plan:  Attention deficit hyperactivity disorder (ADHD) Adequately controlled.  We will change her prescriptions regarding the dosage of her morning pill and her afternoon pill.  She will continue on this regimen.  Alopecia Improving.  She will continue to monitor.  Contraceptive management No longer on oral contraceptive pills.  Previously on this for dysmenorrhea.  She is not sexually active.  She wants to remain off of this currently given issues with alopecia.  I did discuss there are potentially other options that she could try though depending on her preference we may have to have her see a gynecologist in the future.    I discussed the assessment and treatment plan with the patient. The patient was provided an opportunity to ask questions and all were answered. The patient agreed with the plan and demonstrated an understanding of the instructions.   The patient was advised to call back or seek an in-person evaluation if the symptoms worsen or if the condition fails to improve as anticipated.   Marikay AlarEric Velicia Dejager, MD

## 2019-06-05 ENCOUNTER — Telehealth: Payer: Self-pay | Admitting: Family Medicine

## 2019-06-05 NOTE — Telephone Encounter (Signed)
Please let the patient know I heard back from our clinical pharmacist and I as well was able to research the flu vaccine and alopecia.  The alopecia is not a known side effect of the influenza vaccine and thus cannot be considered as a reason not to get the vaccine moving forward.  Unfortunately I will not be able to provide a letter excusing her from having a flu vaccine for this reason.

## 2019-06-05 NOTE — Telephone Encounter (Signed)
-----   Message from De Hollingshead, [facility] sent at 06/04/2019  5:31 PM EDT ----- Agreed, not a documented side effect of influenza vaccination. Would not consider it a reason to not get in the future.   Catie ----- Message ----- From: Leone Haven, MD Sent: 06/04/2019   1:13 PM EDT To: De Hollingshead, [facility]  Hey Catie,   This patient had alopecia areata earlier this year. She is concerned that the flu vaccine contributed to this and wants to defer this years vaccine. I could not find that as a listed side effect in uptodate and wanted to see if that was something that you have heard of. Thanks.   Randall Hiss

## 2019-06-06 NOTE — Telephone Encounter (Signed)
I called and spoke to the patient and explained to her that alopecia is not a reason not to get the flu vaccine and that the provider cannot write her a letter using this as a reason after he  And the pharmacist has researched alopecia and the influenza vaccine, pt understood.  Nina,cma

## 2019-07-03 DIAGNOSIS — L638 Other alopecia areata: Secondary | ICD-10-CM | POA: Diagnosis not present

## 2019-07-03 NOTE — Telephone Encounter (Signed)
error 

## 2019-07-06 ENCOUNTER — Telehealth: Payer: Self-pay

## 2019-07-06 ENCOUNTER — Telehealth: Payer: Self-pay | Admitting: Family Medicine

## 2019-07-06 NOTE — Telephone Encounter (Signed)
Pt would like to know if she can have the pharmacy call in a Rx refill on amphetamine-dextroamphetamine (ADDERALL) 15 MG tablet ?  Pt will run out in 2 weeks. Please advise and Thank you!  Call pt @ [phone].

## 2019-07-07 NOTE — Telephone Encounter (Signed)
I sent refills in during her last visit. She should contact the pharmacy for these. Thanks.

## 2019-07-09 ENCOUNTER — Other Ambulatory Visit: Payer: Self-pay

## 2019-07-09 ENCOUNTER — Ambulatory Visit: Payer: BC Managed Care – PPO | Admitting: Family Medicine

## 2019-07-09 NOTE — Telephone Encounter (Signed)
I called and informed the patient to call her pharmacy about the refills.  Nina,cma

## 2019-08-07 NOTE — Telephone Encounter (Signed)
Medication has been refilled 08/04/19.

## 2019-09-03 ENCOUNTER — Encounter: Payer: Self-pay | Admitting: Family Medicine

## 2019-09-03 ENCOUNTER — Ambulatory Visit (INDEPENDENT_AMBULATORY_CARE_PROVIDER_SITE_OTHER): Payer: BC Managed Care – PPO | Admitting: Family Medicine

## 2019-09-03 ENCOUNTER — Other Ambulatory Visit: Payer: Self-pay

## 2019-09-03 VITALS — Ht 63.0 in | Wt 105.0 lb

## 2019-09-03 DIAGNOSIS — N923 Ovulation bleeding: Secondary | ICD-10-CM

## 2019-09-03 DIAGNOSIS — F909 Attention-deficit hyperactivity disorder, unspecified type: Secondary | ICD-10-CM | POA: Diagnosis not present

## 2019-09-03 NOTE — Progress Notes (Signed)
Virtual Visit via telephone Note  This visit type was conducted due to national recommendations for restrictions regarding the COVID-19 pandemic (e.g. social distancing).  This format is felt to be most appropriate for this patient at this time.  All issues noted in this document were discussed and addressed.  No physical exam was performed (except for noted visual exam findings with Video Visits).   I connected with Krista Mcdonald today at  4:00 PM EST by telephone and verified that I am speaking with the correct person using two identifiers. Location patient: work Location provider: work  Persons participating in the virtual visit: patient, provider  I discussed the limitations, risks, security and privacy concerns of performing an evaluation and management service by telephone and the availability of in person appointments. I also discussed with the patient that there may be a patient responsible charge related to this service. The patient expressed understanding and agreed to proceed.  Interactive audio and video telecommunications were attempted between this provider and patient, however failed, due to patient having technical difficulties OR patient did not have access to video capability.  We continued and completed visit with audio only.  Reason for visit: follow-up  HPI: ADHD: Taking Adderall.  She takes 15 mg in the morning and 5 mg in the afternoon.  Notes this works well for her.  No appetite suppression, sleep changes, or palpitations.  Intermenstrual bleeding: Patient notes this has been going on for last several months.  She came off of her OCP about a year ago and did not have a cycle for about 3 months and then had 1 a couple of months after that and then went to having them every 2 weeks.  They last for 5 days and are normal flow.  She notes she is not currently taking any OCPs.  She denies sexual activity.  She denies abdominal pain, headache, vision changes, fatigue,  shortness of breath, or active bleeding.  LMP was 08/18/2019.  She does have an appointment with gynecology in a couple of months.   ROS: See pertinent positives and negatives per HPI.  Past Medical History:  Diagnosis Date  . ADHD (attention deficit hyperactivity disorder)     Past Surgical History:  Procedure Laterality Date  . NO PAST SURGERIES      Family History  Problem Relation Age of Onset  . Throat cancer Maternal Grandfather     SOCIAL HX: Non-smoker   Current Outpatient Medications:  .  ACZONE 7.5 % GEL, APPLY thin layer TO FACE daily, Disp: , Rfl: 11 .  [START ON 11/06/2019] amphetamine-dextroamphetamine (ADDERALL) 15 MG tablet, Take 1 tablet by mouth daily after breakfast., Disp: 30 tablet, Rfl: 0 .  [START ON 10/06/2019] amphetamine-dextroamphetamine (ADDERALL) 15 MG tablet, Take 1 tablet by mouth daily after breakfast., Disp: 30 tablet, Rfl: 0 .  amphetamine-dextroamphetamine (ADDERALL) 15 MG tablet, Take 1 tablet by mouth daily after breakfast., Disp: 30 tablet, Rfl: 0 .  [START ON 11/06/2019] amphetamine-dextroamphetamine (ADDERALL) 5 MG tablet, Take 1 tablet (5 mg total) by mouth daily after lunch., Disp: 30 tablet, Rfl: 0 .  [START ON 10/06/2019] amphetamine-dextroamphetamine (ADDERALL) 5 MG tablet, Take 1 tablet (5 mg total) by mouth daily after lunch., Disp: 30 tablet, Rfl: 0 .  amphetamine-dextroamphetamine (ADDERALL) 5 MG tablet, Take 1 tablet (5 mg total) by mouth daily after lunch., Disp: 30 tablet, Rfl: 0 .  cycloSPORINE modified (NEORAL) 50 MG capsule, TAKE THREE CAPSULES BY MOUTH EVERY MORNING and TWO CAPSULES EVERY EVENING,  Disp: , Rfl: 0 .  EPIDUO FORTE 0.3-2.5 % GEL, apply ON THE SKIN AT bedtime; TO forehead AND glabella, Disp: , Rfl: 11 .  fluocinonide (LIDEX) 0.05 % external solution, , Disp: , Rfl:  .  tretinoin (RETIN-A) 0.05 % cream, APPLY PEA SIZE AMOUNT TO FACE AT BEDTIME, Disp: , Rfl: 10  EXAM: This is a telehealth telephone visit and thus no  physical exam was completed.  ASSESSMENT AND PLAN:  Discussed the following assessment and plan:  Intermenstrual bleeding Discussed that it would be odd to have irregular menstrual cycles related to an OCP this far out from stopping her OCP.  Discussed evaluation for causes including pelvic exam as well as lab work.  She declined pelvic exam in our office and noted she will discuss that with her gynecologist at her upcoming appointment.  She will complete labs through our office to evaluate for thyroid dysfunction, prolactin abnormalities, pregnancy, and anemia.  Discussed reasons for her to seek medical attention prior to seeing her gynecologist.  Attention deficit hyperactivity disorder (ADHD) Doing well on Adderall with her current regimen.  We will provide refills.      I discussed the assessment and treatment plan with the patient. The patient was provided an opportunity to ask questions and all were answered. The patient agreed with the plan and demonstrated an understanding of the instructions.   The patient was advised to call back or seek an in-person evaluation if the symptoms worsen or if the condition fails to improve as anticipated.  I provided 15 minutes of non-face-to-face time during this encounter.   Tommi Rumps, MD

## 2019-09-05 ENCOUNTER — Telehealth: Payer: Self-pay | Admitting: Family Medicine

## 2019-09-05 DIAGNOSIS — N923 Ovulation bleeding: Secondary | ICD-10-CM | POA: Insufficient documentation

## 2019-09-05 MED ORDER — AMPHETAMINE-DEXTROAMPHETAMINE 15 MG PO TABS: 15.0000 mg | ORAL_TABLET | Freq: Every day | ORAL | 0 refills | Status: DC

## 2019-09-05 MED ORDER — AMPHETAMINE-DEXTROAMPHETAMINE 5 MG PO TABS: 5.0000 mg | ORAL_TABLET | Freq: Every day | ORAL | 0 refills | Status: DC

## 2019-09-05 NOTE — Telephone Encounter (Signed)
I forgot to get this patient's lab work and follow-up in her chart after her visit on Monday.  Can you please call her to get her scheduled for labs and a follow-up visit?  Thanks.  Orders have been placed for her labs.

## 2019-09-05 NOTE — Assessment & Plan Note (Signed)
Discussed that it would be odd to have irregular menstrual cycles related to an OCP this far out from stopping her OCP.  Discussed evaluation for causes including pelvic exam as well as lab work.  She declined pelvic exam in our office and noted she will discuss that with her gynecologist at her upcoming appointment.  She will complete labs through our office to evaluate for thyroid dysfunction, prolactin abnormalities, pregnancy, and anemia.  Discussed reasons for her to seek medical attention prior to seeing her gynecologist.

## 2019-09-05 NOTE — Assessment & Plan Note (Signed)
Doing well on Adderall with her current regimen.  We will provide refills.

## 2019-09-10 NOTE — Telephone Encounter (Signed)
Pt has been scheduled for labs and follow up.

## 2019-09-17 ENCOUNTER — Other Ambulatory Visit: Payer: Self-pay

## 2019-09-17 ENCOUNTER — Other Ambulatory Visit (INDEPENDENT_AMBULATORY_CARE_PROVIDER_SITE_OTHER): Payer: BC Managed Care – PPO

## 2019-09-17 DIAGNOSIS — N923 Ovulation bleeding: Secondary | ICD-10-CM | POA: Diagnosis not present

## 2019-09-17 LAB — CBC
HCT: 43.2 % (ref 36.0–46.0)
Hemoglobin: 14.4 g/dL (ref 12.0–15.0)
MCHC: 33.4 g/dL (ref 30.0–36.0)
MCV: 93 fl (ref 78.0–100.0)
Platelets: 234 10*3/uL (ref 150.0–400.0)
RBC: 4.64 Mil/uL (ref 3.87–5.11)
RDW: 12.3 % (ref 11.5–15.5)
WBC: 5.7 10*3/uL (ref 4.0–10.5)

## 2019-09-17 LAB — TSH: TSH: 0.87 u[IU]/mL (ref 0.35–4.50)

## 2019-09-17 LAB — POCT URINE PREGNANCY: Preg Test, Ur: NEGATIVE

## 2019-09-18 LAB — PROLACTIN: Prolactin: 5.7 ng/mL

## 2019-10-01 ENCOUNTER — Ambulatory Visit: Payer: BC Managed Care – PPO | Attending: Internal Medicine

## 2019-10-01 DIAGNOSIS — Z20822 Contact with and (suspected) exposure to covid-19: Secondary | ICD-10-CM

## 2019-10-03 LAB — NOVEL CORONAVIRUS, NAA: SARS-CoV-2, NAA: NOT DETECTED

## 2019-10-31 DIAGNOSIS — L668 Other cicatricial alopecia: Secondary | ICD-10-CM | POA: Diagnosis not present

## 2019-11-24 DIAGNOSIS — Z23 Encounter for immunization: Secondary | ICD-10-CM | POA: Diagnosis not present

## 2019-12-10 ENCOUNTER — Ambulatory Visit: Payer: BC Managed Care – PPO | Admitting: Family Medicine

## 2019-12-10 DIAGNOSIS — Z0289 Encounter for other administrative examinations: Secondary | ICD-10-CM

## 2019-12-18 ENCOUNTER — Telehealth: Payer: Self-pay | Admitting: Family Medicine

## 2019-12-18 MED ORDER — AMPHETAMINE-DEXTROAMPHETAMINE 15 MG PO TABS: 15.0000 mg | ORAL_TABLET | Freq: Every day | ORAL | 0 refills | Status: DC

## 2019-12-18 MED ORDER — AMPHETAMINE-DEXTROAMPHETAMINE 5 MG PO TABS: 5.0000 mg | ORAL_TABLET | Freq: Every day | ORAL | 0 refills | Status: DC

## 2019-12-18 NOTE — Telephone Encounter (Signed)
I will send one refill in for her though she missed her most recent follow-up and will have to be seen in the next month to get her next refill.

## 2019-12-18 NOTE — Telephone Encounter (Signed)
Mychart sent to inform patient. 

## 2019-12-18 NOTE — Telephone Encounter (Signed)
Refill request for adderall, last seen 09-03-19, last filled 11-06-19.  Please advise.

## 2019-12-18 NOTE — Telephone Encounter (Signed)
Pt needs refill on Adderall 15mg  immediate release sent to [facility] in [place]. She said the pharmacy usually makes her call to get a refill. She also said she only have 3 pills left.

## 2020-01-01 DIAGNOSIS — Z1231 Encounter for screening mammogram for malignant neoplasm of breast: Secondary | ICD-10-CM | POA: Diagnosis not present

## 2020-01-01 DIAGNOSIS — N939 Abnormal uterine and vaginal bleeding, unspecified: Secondary | ICD-10-CM | POA: Diagnosis not present

## 2020-01-01 DIAGNOSIS — N926 Irregular menstruation, unspecified: Secondary | ICD-10-CM | POA: Diagnosis not present

## 2020-01-01 DIAGNOSIS — Z113 Encounter for screening for infections with a predominantly sexual mode of transmission: Secondary | ICD-10-CM | POA: Diagnosis not present

## 2020-01-01 DIAGNOSIS — Z01419 Encounter for gynecological examination (general) (routine) without abnormal findings: Secondary | ICD-10-CM | POA: Diagnosis not present

## 2020-02-13 DIAGNOSIS — Z30430 Encounter for insertion of intrauterine contraceptive device: Secondary | ICD-10-CM | POA: Diagnosis not present

## 2020-02-13 DIAGNOSIS — Z113 Encounter for screening for infections with a predominantly sexual mode of transmission: Secondary | ICD-10-CM | POA: Diagnosis not present

## 2020-02-13 DIAGNOSIS — Z3200 Encounter for pregnancy test, result unknown: Secondary | ICD-10-CM | POA: Diagnosis not present

## 2020-02-13 DIAGNOSIS — N882 Stricture and stenosis of cervix uteri: Secondary | ICD-10-CM | POA: Diagnosis not present

## 2020-02-22 ENCOUNTER — Encounter: Payer: Self-pay | Admitting: Internal Medicine

## 2020-02-22 ENCOUNTER — Ambulatory Visit: Payer: BC Managed Care – PPO | Admitting: Internal Medicine

## 2020-02-22 ENCOUNTER — Other Ambulatory Visit: Payer: Self-pay

## 2020-02-22 VITALS — BP 130/70 | HR 107 | Temp 98.3°F | Ht 63.0 in | Wt 106.2 lb

## 2020-02-22 DIAGNOSIS — M5416 Radiculopathy, lumbar region: Secondary | ICD-10-CM | POA: Diagnosis not present

## 2020-02-22 DIAGNOSIS — M5441 Lumbago with sciatica, right side: Secondary | ICD-10-CM | POA: Diagnosis not present

## 2020-02-22 MED ORDER — KETOROLAC TROMETHAMINE 60 MG/2ML IM SOLN
30.0000 mg | Freq: Once | INTRAMUSCULAR | Status: AC
  Administered 2020-02-22: 30 mg via INTRAMUSCULAR

## 2020-02-22 MED ORDER — CYCLOBENZAPRINE HCL 5 MG PO TABS: 5.0000 mg | ORAL_TABLET | Freq: Every evening | ORAL | 2 refills | Status: DC | PRN

## 2020-02-22 MED ORDER — METHYLPREDNISOLONE ACETATE 40 MG/ML IJ SUSP
80.0000 mg | Freq: Once | INTRAMUSCULAR | Status: AC
  Administered 2020-02-22: 80 mg via INTRAMUSCULAR

## 2020-02-22 NOTE — Addendum Note (Signed)
Addended by: Tilford Pillar on: 02/22/2020 10:59 AM   Modules accepted: Orders

## 2020-02-22 NOTE — Patient Instructions (Addendum)
voltaren gel over the counter/aspercream/biofreeze   Back Exercises These exercises help to make your trunk and back strong. They also help to keep the lower back flexible. Doing these exercises can help to prevent back pain or lessen existing pain.  If you have back pain, try to do these exercises 2-3 times each day or as told by your doctor.  As you get better, do the exercises once each day. Repeat the exercises more often as told by your doctor.  To stop back pain from coming back, do the exercises once each day, or as told by your doctor. Exercises Single knee to chest Do these steps 3-5 times in a row for each leg: 1. Lie on your back on a firm bed or the floor with your legs stretched out. 2. Bring one knee to your chest. 3. Grab your knee or thigh with both hands and hold them it in place. 4. Pull on your knee until you feel a gentle stretch in your lower back or buttocks. 5. Keep doing the stretch for 10-30 seconds. 6. Slowly let go of your leg and straighten it. Pelvic tilt Do these steps 5-10 times in a row: 1. Lie on your back on a firm bed or the floor with your legs stretched out. 2. Bend your knees so they point up to the ceiling. Your feet should be flat on the floor. 3. Tighten your lower belly (abdomen) muscles to press your lower back against the floor. This will make your tailbone point up to the ceiling instead of pointing down to your feet or the floor. 4. Stay in this position for 5-10 seconds while you gently tighten your muscles and breathe evenly. Cat-cow Do these steps until your lower back bends more easily: 1. Get on your hands and knees on a firm surface. Keep your hands under your shoulders, and keep your knees under your hips. You may put padding under your knees. 2. Let your head hang down toward your chest. Tighten (contract) the muscles in your belly. Point your tailbone toward the floor so your lower back becomes rounded like the back of a cat. 3. Stay  in this position for 5 seconds. 4. Slowly lift your head. Let the muscles of your belly relax. Point your tailbone up toward the ceiling so your back forms a sagging arch like the back of a cow. 5. Stay in this position for 5 seconds.  Press-ups Do these steps 5-10 times in a row: 1. Lie on your belly (face-down) on the floor. 2. Place your hands near your head, about shoulder-width apart. 3. While you keep your back relaxed and keep your hips on the floor, slowly straighten your arms to raise the top half of your body and lift your shoulders. Do not use your back muscles. You may change where you place your hands in order to make yourself more comfortable. 4. Stay in this position for 5 seconds. 5. Slowly return to lying flat on the floor.  Bridges Do these steps 10 times in a row: 1. Lie on your back on a firm surface. 2. Bend your knees so they point up to the ceiling. Your feet should be flat on the floor. Your arms should be flat at your sides, next to your body. 3. Tighten your butt muscles and lift your butt off the floor until your waist is almost as high as your knees. If you do not feel the muscles working in your butt and the back of  your thighs, slide your feet 1-2 inches farther away from your butt. 4. Stay in this position for 3-5 seconds. 5. Slowly lower your butt to the floor, and let your butt muscles relax. If this exercise is too easy, try doing it with your arms crossed over your chest. Belly crunches Do these steps 5-10 times in a row: 1. Lie on your back on a firm bed or the floor with your legs stretched out. 2. Bend your knees so they point up to the ceiling. Your feet should be flat on the floor. 3. Cross your arms over your chest. 4. Tip your chin a little bit toward your chest but do not bend your neck. 5. Tighten your belly muscles and slowly raise your chest just enough to lift your shoulder blades a tiny bit off of the floor. Avoid raising your body higher than  that, because it can put too much stress on your low back. 6. Slowly lower your chest and your head to the floor. Back lifts Do these steps 5-10 times in a row: 1. Lie on your belly (face-down) with your arms at your sides, and rest your forehead on the floor. 2. Tighten the muscles in your legs and your butt. 3. Slowly lift your chest off of the floor while you keep your hips on the floor. Keep the back of your head in line with the curve in your back. Look at the floor while you do this. 4. Stay in this position for 3-5 seconds. 5. Slowly lower your chest and your face to the floor. Contact a doctor if:  Your back pain gets a lot worse when you do an exercise.  Your back pain does not get better 2 hours after you exercise. If you have any of these problems, stop doing the exercises. Do not do them again unless your doctor says it is okay. Get help right away if:  You have sudden, very bad back pain. If this happens, stop doing the exercises. Do not do them again unless your doctor says it is okay. This information is not intended to replace advice given to you by your health care provider. Make sure you discuss any questions you have with your health care provider. Document Revised: 05/25/2018 Document Reviewed: 05/25/2018 Elsevier Patient Education  2020 [employer].  Low Back Sprain or Strain Rehab Ask your health care provider which exercises are safe for you. Do exercises exactly as told by your health care provider and adjust them as directed. It is normal to feel mild stretching, pulling, tightness, or discomfort as you do these exercises. Stop right away if you feel sudden pain or your pain gets worse. Do not begin these exercises until told by your health care provider. Stretching and range-of-motion exercises These exercises warm up your muscles and joints and improve the movement and flexibility of your back. These exercises also help to relieve pain, numbness, and  tingling. Lumbar rotation  1. Lie on your back on a firm surface and bend your knees. 2. Straighten your arms out to your sides so each arm forms a 90-degree angle (right angle) with a side of your body. 3. Slowly move (rotate) both of your knees to one side of your body until you feel a stretch in your lower back (lumbar). Try not to let your shoulders lift off the floor. 4. Hold this position for __________ seconds. 5. Tense your abdominal muscles and slowly move your knees back to the starting position. 6. Repeat  this exercise on the other side of your body. Repeat __________ times. Complete this exercise __________ times a day. Single knee to chest  7. Lie on your back on a firm surface with both legs straight. 8. Bend one of your knees. Use your hands to move your knee up toward your chest until you feel a gentle stretch in your lower back and buttock. ? Hold your leg in this position by holding on to the front of your knee. ? Keep your other leg as straight as possible. 9. Hold this position for __________ seconds. 10. Slowly return to the starting position. 11. Repeat with your other leg. Repeat __________ times. Complete this exercise __________ times a day. Prone extension on elbows  5. Lie on your abdomen on a firm surface (prone position). 6. Prop yourself up on your elbows. 7. Use your arms to help lift your chest up until you feel a gentle stretch in your abdomen and your lower back. ? This will place some of your body weight on your elbows. If this is uncomfortable, try stacking pillows under your chest. ? Your hips should stay down, against the surface that you are lying on. Keep your hip and back muscles relaxed. 8. Hold this position for __________ seconds. 9. Slowly relax your upper body and return to the starting position. Repeat __________ times. Complete this exercise __________ times a day. Strengthening exercises These exercises build strength and endurance in  your back. Endurance is the ability to use your muscles for a long time, even after they get tired. Pelvic tilt This exercise strengthens the muscles that lie deep in the abdomen. 6. Lie on your back on a firm surface. Bend your knees and keep your feet flat on the floor. 7. Tense your abdominal muscles. Tip your pelvis up toward the ceiling and flatten your lower back into the floor. ? To help with this exercise, you may place a small towel under your lower back and try to push your back into the towel. 8. Hold this position for __________ seconds. 9. Let your muscles relax completely before you repeat this exercise. Repeat __________ times. Complete this exercise __________ times a day. Alternating arm and leg raises  6. Get on your hands and knees on a firm surface. If you are on a hard floor, you may want to use padding, such as an exercise mat, to cushion your knees. 7. Line up your arms and legs. Your hands should be directly below your shoulders, and your knees should be directly below your hips. 8. Lift your left leg behind you. At the same time, raise your right arm and straighten it in front of you. ? Do not lift your leg higher than your hip. ? Do not lift your arm higher than your shoulder. ? Keep your abdominal and back muscles tight. ? Keep your hips facing the ground. ? Do not arch your back. ? Keep your balance carefully, and do not hold your breath. 9. Hold this position for __________ seconds. 10. Slowly return to the starting position. 11. Repeat with your right leg and your left arm. Repeat __________ times. Complete this exercise __________ times a day. Abdominal set with straight leg raise  6. Lie on your back on a firm surface. 7. Bend one of your knees and keep your other leg straight. 8. Tense your abdominal muscles and lift your straight leg up, 4-6 inches (10-15 cm) off the ground. 9. Keep your abdominal muscles tight and hold this  position for __________  seconds. ? Do not hold your breath. ? Do not arch your back. Keep it flat against the ground. 10. Keep your abdominal muscles tense as you slowly lower your leg back to the starting position. 11. Repeat with your other leg. Repeat __________ times. Complete this exercise __________ times a day. Single leg lower with bent knees 7. Lie on your back on a firm surface. 8. Tense your abdominal muscles and lift your feet off the floor, one foot at a time, so your knees and hips are bent in 90-degree angles (right angles). ? Your knees should be over your hips and your lower legs should be parallel to the floor. 9. Keeping your abdominal muscles tense and your knee bent, slowly lower one of your legs so your toe touches the ground. 10. Lift your leg back up to return to the starting position. ? Do not hold your breath. ? Do not let your back arch. Keep your back flat against the ground. 11. Repeat with your other leg. Repeat __________ times. Complete this exercise __________ times a day. Posture and body mechanics Good posture and healthy body mechanics can help to relieve stress in your body's tissues and joints. Body mechanics refers to the movements and positions of your body while you do your daily activities. Posture is part of body mechanics. Good posture means:  Your spine is in its natural S-curve position (neutral).  Your shoulders are pulled back slightly.  Your head is not tipped forward. Follow these guidelines to improve your posture and body mechanics in your everyday activities. Standing   When standing, keep your spine neutral and your feet about hip width apart. Keep a slight bend in your knees. Your ears, shoulders, and hips should line up.  When you do a task in which you stand in one place for a long time, place one foot up on a stable object that is 2-4 inches (5-10 cm) high, such as a footstool. This helps keep your spine neutral. Sitting   When sitting, keep your  spine neutral and keep your feet flat on the floor. Use a footrest, if necessary, and keep your thighs parallel to the floor. Avoid rounding your shoulders, and avoid tilting your head forward.  When working at a desk or a computer, keep your desk at a height where your hands are slightly lower than your elbows. Slide your chair under your desk so you are close enough to maintain good posture.  When working at a computer, place your monitor at a height where you are looking straight ahead and you do not have to tilt your head forward or downward to look at the screen. Resting  When lying down and resting, avoid positions that are most painful for you.  If you have pain with activities such as sitting, bending, stooping, or squatting, lie in a position in which your body does not bend very much. For example, avoid curling up on your side with your arms and knees near your chest (fetal position).  If you have pain with activities such as standing for a long time or reaching with your arms, lie with your spine in a neutral position and bend your knees slightly. Try the following positions: ? Lying on your side with a pillow between your knees. ? Lying on your back with a pillow under your knees. Lifting   When lifting objects, keep your feet at least shoulder width apart and tighten your abdominal muscles.  [employer]  your knees and hips and keep your spine neutral. It is important to lift using the strength of your legs, not your back. Do not lock your knees straight out.  Always ask for help to lift heavy or awkward objects. This information is not intended to replace advice given to you by your health care provider. Make sure you discuss any questions you have with your health care provider. Document Revised: 12/22/2018 Document Reviewed: 09/21/2018 Elsevier Patient Education  2020 [employer].  Sciatica Rehab Ask your health care provider which exercises are safe for you. Do exercises exactly  as told by your health care provider and adjust them as directed. It is normal to feel mild stretching, pulling, tightness, or discomfort as you do these exercises. Stop right away if you feel sudden pain or your pain gets worse. Do not begin these exercises until told by your health care provider. Stretching and range-of-motion exercises These exercises warm up your muscles and joints and improve the movement and flexibility of your hips and back. These exercises also help to relieve pain, numbness, and tingling. Sciatic nerve glide 7. Sit in a chair with your head facing down toward your chest. Place your hands behind your back. Let your shoulders slump forward. 8. Slowly straighten one of your legs while you tilt your head back as if you are looking toward the ceiling. Only straighten your leg as far as you can without making your symptoms worse. 9. Hold this position for __________ seconds. 10. Slowly return to the starting position. 11. Repeat with your other leg. Repeat __________ times. Complete this exercise __________ times a day. Knee to chest with hip adduction and internal rotation  12. Lie on your back on a firm surface with both legs straight. 13. Bend one of your knees and move it up toward your chest until you feel a gentle stretch in your lower back and buttock. Then, move your knee toward the shoulder that is on the opposite side from your leg. This is hip adduction and internal rotation. ? Hold your leg in this position by holding on to the front of your knee. 14. Hold this position for __________ seconds. 15. Slowly return to the starting position. 16. Repeat with your other leg. Repeat __________ times. Complete this exercise __________ times a day. Prone extension on elbows  10. Lie on your abdomen on a firm surface. A bed may be too soft for this exercise. 11. Prop yourself up on your elbows. 12. Use your arms to help lift your chest up until you feel a gentle stretch in  your abdomen and your lower back. ? This will place some of your body weight on your elbows. If this is uncomfortable, try stacking pillows under your chest. ? Your hips should stay down, against the surface that you are lying on. Keep your hip and back muscles relaxed. 13. Hold this position for __________ seconds. 14. Slowly relax your upper body and return to the starting position. Repeat __________ times. Complete this exercise __________ times a day. Strengthening exercises These exercises build strength and endurance in your back. Endurance is the ability to use your muscles for a long time, even after they get tired. Pelvic tilt This exercise strengthens the muscles that lie deep in the abdomen. 10. Lie on your back on a firm surface. Bend your knees and keep your feet flat on the floor. 11. Tense your abdominal muscles. Tip your pelvis up toward the ceiling and flatten your lower back  into the floor. ? To help with this exercise, you may place a small towel under your lower back and try to push your back into the towel. 12. Hold this position for __________ seconds. 13. Let your muscles relax completely before you repeat this exercise. Repeat __________ times. Complete this exercise __________ times a day. Alternating arm and leg raises  12. Get on your hands and knees on a firm surface. If you are on a hard floor, you may want to use padding, such as an exercise mat, to cushion your knees. 13. Line up your arms and legs. Your hands should be directly below your shoulders, and your knees should be directly below your hips. 14. Lift your left leg behind you. At the same time, raise your right arm and straighten it in front of you. ? Do not lift your leg higher than your hip. ? Do not lift your arm higher than your shoulder. ? Keep your abdominal and back muscles tight. ? Keep your hips facing the ground. ? Do not arch your back. ? Keep your balance carefully, and do not hold your  breath. 15. Hold this position for __________ seconds. 16. Slowly return to the starting position. 17. Repeat with your right leg and your left arm. Repeat __________ times. Complete this exercise __________ times a day. Posture and body mechanics Good posture and healthy body mechanics can help to relieve stress in your body's tissues and joints. Body mechanics refers to the movements and positions of your body while you do your daily activities. Posture is part of body mechanics. Good posture means:  Your spine is in its natural S-curve position (neutral).  Your shoulders are pulled back slightly.  Your head is not tipped forward. Follow these guidelines to improve your posture and body mechanics in your everyday activities. Standing   When standing, keep your spine neutral and your feet about hip width apart. Keep a slight bend in your knees. Your ears, shoulders, and hips should line up.  When you do a task in which you stand in one place for a long time, place one foot up on a stable object that is 2-4 inches (5-10 cm) high, such as a footstool. This helps keep your spine neutral. Sitting   When sitting, keep your spine neutral and keep your feet flat on the floor. Use a footrest, if necessary, and keep your thighs parallel to the floor. Avoid rounding your shoulders, and avoid tilting your head forward.  When working at a desk or a computer, keep your desk at a height where your hands are slightly lower than your elbows. Slide your chair under your desk so you are close enough to maintain good posture.  When working at a computer, place your monitor at a height where you are looking straight ahead and you do not have to tilt your head forward or downward to look at the screen. Resting  When lying down and resting, avoid positions that are most painful for you.  If you have pain with activities such as sitting, bending, stooping, or squatting, lie in a position in which your body  does not bend very much. For example, avoid curling up on your side with your arms and knees near your chest (fetal position).  If you have pain with activities such as standing for a long time or reaching with your arms, lie with your spine in a neutral position and bend your knees slightly. Try the following positions: ? Lying on  your side with a pillow between your knees. ? Lying on your back with a pillow under your knees. Lifting   When lifting objects, keep your feet at least shoulder width apart and tighten your abdominal muscles.  Bend your knees and hips and keep your spine neutral. It is important to lift using the strength of your legs, not your back. Do not lock your knees straight out.  Always ask for help to lift heavy or awkward objects. This information is not intended to replace advice given to you by your health care provider. Make sure you discuss any questions you have with your health care provider. Document Revised: 12/22/2018 Document Reviewed: 09/21/2018 Elsevier Patient Education  2020 [employer]  Sciatica  Sciatica is pain, weakness, tingling, or loss of feeling (numbness) along the sciatic nerve. The sciatic nerve starts in the lower back and goes down the back of each leg. Sciatica usually goes away on its own or with treatment. Sometimes, sciatica may come back (recur). What are the causes? This condition happens when the sciatic nerve is pinched or has pressure put on it. This may be the result of:  A disk in between the bones of the spine bulging out too far (herniated disk).  Changes in the spinal disks that occur with aging.  A condition that affects a muscle in the butt.  Extra bone growth near the sciatic nerve.  A break (fracture) of the area between your hip bones (pelvis).  Pregnancy.  Tumor. This is rare. What increases the risk? You are more likely to develop this condition if you:  Play sports that put pressure or stress on the  spine.  Have poor strength and ease of movement (flexibility).  Have had a back injury in the past.  Have had back surgery.  Sit for long periods of time.  Do activities that involve bending or lifting over and over again.  Are very overweight (obese). What are the signs or symptoms? Symptoms can vary from mild to very bad. They may include:  Any of these problems in the lower back, leg, hip, or butt: ? Mild tingling, loss of feeling, or dull aches. ? Burning sensations. ? Sharp pains.  Loss of feeling in the back of the calf or the sole of the foot.  Leg weakness.  Very bad back pain that makes it hard to move. These symptoms may get worse when you cough, sneeze, or laugh. They may also get worse when you sit or stand for long periods of time. How is this treated? This condition often gets better without any treatment. However, treatment may include:  Changing or cutting back on physical activity when you have pain.  Doing exercises and stretching.  Putting ice or heat on the affected area.  Medicines that help: ? To relieve pain and swelling. ? To relax your muscles.  Shots (injections) of medicines that help to relieve pain, irritation, and swelling.  Surgery. Follow these instructions at home: Medicines  Take over-the-counter and prescription medicines only as told by your doctor.  Ask your doctor if the medicine prescribed to you: ? Requires you to avoid driving or using heavy machinery. ? Can cause trouble pooping (constipation). You may need to take these steps to prevent or treat trouble pooping:  Drink enough fluids to keep your pee (urine) pale yellow.  Take over-the-counter or prescription medicines.  Eat foods that are high in fiber. These include beans, whole grains, and fresh fruits and vegetables.  Limit  foods that are high in fat and sugar. These include fried or sweet foods. Managing pain      If told, put ice on the affected  area. ? Put ice in a plastic bag. ? Place a towel between your skin and the bag. ? Leave the ice on for 20 minutes, 2-3 times a day.  If told, put heat on the affected area. Use the heat source that your doctor tells you to use, such as a moist heat pack or a heating pad. ? Place a towel between your skin and the heat source. ? Leave the heat on for 20-30 minutes. ? Remove the heat if your skin turns bright red. This is very important if you are unable to feel pain, heat, or cold. You may have a greater risk of getting burned. Activity   Return to your normal activities as told by your doctor. Ask your doctor what activities are safe for you.  Avoid activities that make your symptoms worse.  Take short rests during the day. ? When you rest for a long time, do some physical activity or stretching between periods of rest. ? Avoid sitting for a long time without moving. Get up and move around at least one time each hour.  Exercise and stretch regularly, as told by your doctor.  Do not lift anything that is heavier than 10 lb (4.5 kg) while you have symptoms of sciatica. ? Avoid lifting heavy things even when you do not have symptoms. ? Avoid lifting heavy things over and over.  When you lift objects, always lift in a way that is safe for your body. To do this, you should: ? Bend your knees. ? Keep the object close to your body. ? Avoid twisting. General instructions  Stay at a healthy weight.  Wear comfortable shoes that support your feet. Avoid wearing high heels.  Avoid sleeping on a mattress that is too soft or too hard. You might have less pain if you sleep on a mattress that is firm enough to support your back.  Keep all follow-up visits as told by your doctor. This is important. Contact a doctor if:  You have pain that: ? Wakes you up when you are sleeping. ? Gets worse when you lie down. ? Is worse than the pain you have had in the past. ? Lasts longer than 4  weeks.  You lose weight without trying. Get help right away if:  You cannot control when you pee (urinate) or poop (have a bowel movement).  You have weakness in any of these areas and it gets worse: ? Lower back. ? The area between your hip bones. ? Butt. ? Legs.  You have redness or swelling of your back.  You have a burning feeling when you pee. Summary  Sciatica is pain, weakness, tingling, or loss of feeling (numbness) along the sciatic nerve.  This condition happens when the sciatic nerve is pinched or has pressure put on it.  Sciatica can cause pain, tingling, or loss of feeling (numbness) in the lower back, legs, hips, and butt.  Treatment often includes rest, exercise, medicines, and putting ice or heat on the affected area. This information is not intended to replace advice given to you by your health care provider. Make sure you discuss any questions you have with your health care provider. Document Revised: 09/18/2018 Document Reviewed: 09/18/2018 Elsevier Patient Education  [place].
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
## 2020-03-13 ENCOUNTER — Telehealth: Payer: Self-pay | Admitting: Family Medicine

## 2020-03-13 NOTE — Telephone Encounter (Signed)
Pt needs a refill on amphetamine-dextroamphetamine (ADDERALL) 15 MG tablet sent to [facility]

## 2020-03-13 NOTE — Telephone Encounter (Signed)
This patient needs to follow up for her ADHD. She hasn't been seen since December. Please get her scheduled and I will refill the medication once she is seen in the office.

## 2020-03-13 NOTE — Telephone Encounter (Signed)
Called and spoke with patient and she is scheduled for a f/up nxt week.  Quan Cybulski,cma

## 2020-03-20 DIAGNOSIS — Z30431 Encounter for routine checking of intrauterine contraceptive device: Secondary | ICD-10-CM | POA: Diagnosis not present

## 2020-03-21 ENCOUNTER — Telehealth: Payer: BC Managed Care – PPO | Admitting: Family Medicine

## 2020-03-24 ENCOUNTER — Other Ambulatory Visit: Payer: Self-pay

## 2020-03-24 ENCOUNTER — Ambulatory Visit: Payer: BC Managed Care – PPO | Admitting: Family Medicine

## 2020-03-24 DIAGNOSIS — F909 Attention-deficit hyperactivity disorder, unspecified type: Secondary | ICD-10-CM | POA: Diagnosis not present

## 2020-03-24 MED ORDER — AMPHETAMINE-DEXTROAMPHETAMINE 15 MG PO TABS: 15.0000 mg | ORAL_TABLET | Freq: Every day | ORAL | 0 refills | Status: DC

## 2020-03-24 MED ORDER — AMPHETAMINE-DEXTROAMPHETAMINE 5 MG PO TABS: 5.0000 mg | ORAL_TABLET | Freq: Every day | ORAL | 0 refills | Status: DC

## 2020-03-24 NOTE — Assessment & Plan Note (Addendum)
Doing well on current regimen.  Refills given.  She will let us know if she has recurrent sleeping issues with her normal regimen.

## 2020-03-24 NOTE — Progress Notes (Signed)
Marikay Alar, MD Phone: [phone]  Krista Mcdonald is a 24 y.o. female who presents today for f/u.  ADHD Medication: adderall 15 mg in am and 5 mg pm, ran out of 15 mg tablets recently and could tell a difference Effectiveness: yes Palpitations: no Sleep difficulty: Did note some sleeping difficulty when she was taking to 5 mg tablets in the morning when she ran out of the 15 mg tablets Appetite suppression: no   Social History   Tobacco Use  Smoking Status Never Smoker  Smokeless Tobacco Never Used     ROS see history of present illness  Objective  Physical Exam Vitals:   03/24/20 1536  BP: 100/60  Pulse: 93  Temp: 98.1 F (36.7 C)  SpO2: 98%    BP Readings from Last 3 Encounters:  03/24/20 100/60  02/22/20 130/70  08/16/18 118/78   Wt Readings from Last 3 Encounters:  03/24/20 105 lb 12.8 oz (48 kg)  02/22/20 106 lb 3.2 oz (48.2 kg)  09/03/19 105 lb (47.6 kg)    Physical Exam Constitutional:      General: She is not in acute distress.    Appearance: She is not diaphoretic.  Cardiovascular:     Rate and Rhythm: Normal rate and regular rhythm.     Heart sounds: Normal heart sounds.  Pulmonary:     Effort: Pulmonary effort is normal.     Breath sounds: Normal breath sounds.  Skin:    General: Skin is warm and dry.  Neurological:     Mental Status: She is alert.      Assessment/Plan: Please see individual problem list.  Attention deficit hyperactivity disorder (ADHD) Doing well on current regimen.  Refills given.  She will let us know if she has recurrent sleeping issues with her normal regimen.   Health Maintenance: Patient is starting nursing school.  She reports she had a tetanus vaccine earlier this year.  She will send [place] a copy of that.  She declines meningococcal B vaccine.  No orders of the defined types were placed in this encounter.   Meds ordered this encounter  Medications  . amphetamine-dextroamphetamine (ADDERALL) 15  MG tablet    Sig: Take 1 tablet by mouth daily after breakfast.    Dispense:  30 tablet    Refill:  0  . amphetamine-dextroamphetamine (ADDERALL) 15 MG tablet    Sig: Take 1 tablet by mouth daily after breakfast.    Dispense:  30 tablet    Refill:  0  . amphetamine-dextroamphetamine (ADDERALL) 15 MG tablet    Sig: Take 1 tablet by mouth daily after breakfast.    Dispense:  30 tablet    Refill:  0  . amphetamine-dextroamphetamine (ADDERALL) 5 MG tablet    Sig: Take 1 tablet (5 mg total) by mouth daily after lunch.    Dispense:  30 tablet    Refill:  0  . amphetamine-dextroamphetamine (ADDERALL) 5 MG tablet    Sig: Take 1 tablet (5 mg total) by mouth daily after lunch.    Dispense:  30 tablet    Refill:  0  . amphetamine-dextroamphetamine (ADDERALL) 5 MG tablet    Sig: Take 1 tablet (5 mg total) by mouth daily after lunch.    Dispense:  30 tablet    Refill:  0    This visit occurred during the SARS-CoV-2 public health emergency.  Safety protocols were in place, including screening questions prior to the visit, additional usage of staff PPE, and extensive cleaning  of exam room while observing appropriate contact time as indicated for disinfecting solutions.    Tommi Rumps, MD [place]

## 2020-03-24 NOTE — Patient Instructions (Signed)
Nice to see you. Please get [place] a copy of your tetanus vaccine. Please let me know if your sleep does not improve with going back to your prior Adderall regimen.

## 2020-03-27 ENCOUNTER — Telehealth: Payer: Self-pay | Admitting: Family Medicine

## 2020-03-27 NOTE — Telephone Encounter (Signed)
Patient dropped off a form for Dr. Birdie Sons to sign. 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 Form is up front in color folder.

## 2020-04-01 ENCOUNTER — Telehealth: Payer: Self-pay | Admitting: Family Medicine

## 2020-04-01 NOTE — Telephone Encounter (Signed)
Patient brought more information for Dr. Birdie Sons to complete form. Papers are up front in Dr. Purvis Sheffield color folder. Please call when ready

## 2020-04-02 NOTE — Telephone Encounter (Signed)
It looks like she needs a TB test. Does she want [place] to do this with a blood test?

## 2020-04-03 NOTE — Telephone Encounter (Signed)
I called and spoke with the patient and she stated she had this done through [facility] in [place] as bloodwork already and it was negative and she did not know how to put it on mychart.  Sriansh Farra,cma

## 2020-04-04 NOTE — Telephone Encounter (Signed)
Signed.

## 2020-04-04 NOTE — Telephone Encounter (Signed)
I called the patient and informed her that her forms were ready and she came by and picked it up.  Corrado Hymon,cma

## 2020-05-07 ENCOUNTER — Other Ambulatory Visit: Payer: Self-pay

## 2020-05-07 ENCOUNTER — Ambulatory Visit (INDEPENDENT_AMBULATORY_CARE_PROVIDER_SITE_OTHER): Payer: PRIVATE HEALTH INSURANCE | Admitting: Family Medicine

## 2020-05-07 ENCOUNTER — Encounter: Payer: Self-pay | Admitting: Family Medicine

## 2020-05-07 DIAGNOSIS — F909 Attention-deficit hyperactivity disorder, unspecified type: Secondary | ICD-10-CM | POA: Diagnosis not present

## 2020-05-07 MED ORDER — AMPHETAMINE-DEXTROAMPHET ER 15 MG PO CP24: 15.0000 mg | ORAL_CAPSULE | ORAL | 0 refills | Status: DC

## 2020-05-07 NOTE — Patient Instructions (Signed)
Nice to see you. We will change your Adderall 15 mg dose to the extended release version.  If you notice any sleep issues, palpitations, or appetite changes with this please let us know.

## 2020-05-07 NOTE — Progress Notes (Signed)
  Marikay Alar, MD Phone: [phone]  Krista Mcdonald is a 24 y.o. female who presents today for f/u.  ADHD Medication: Adderall 15 mg in the morning and 5 mg in the afternoon Effectiveness: Patient feels as though the 50 mg dose is not lasting long enough into the day.  Her classes are now in the morning and last longer than they had in college.  She wonders about switching to the extended release version Palpitations: Noted one episode after working out where her heart rate would increase when she breathed in and would slow back down when she breathed out after exercising.  Has not recurred. Sleep difficulty: No Appetite suppression: No   Social History   Tobacco Use  Smoking Status Never Smoker  Smokeless Tobacco Never Used     ROS see history of present illness  Objective  Physical Exam Vitals:   05/07/20 1522  BP: 110/76  Pulse: 85  Temp: 98.6 F (37 C)  SpO2: 99%    BP Readings from Last 3 Encounters:  05/07/20 110/76  03/24/20 100/60  02/22/20 130/70   Wt Readings from Last 3 Encounters:  05/07/20 105 lb 12.8 oz (48 kg)  03/24/20 105 lb 12.8 oz (48 kg)  02/22/20 106 lb 3.2 oz (48.2 kg)    Physical Exam Constitutional:      General: She is not in acute distress.    Appearance: She is not diaphoretic.  Cardiovascular:     Rate and Rhythm: Normal rate and regular rhythm.     Heart sounds: Normal heart sounds.  Pulmonary:     Effort: Pulmonary effort is normal.     Breath sounds: Normal breath sounds.  Skin:    General: Skin is warm and dry.  Neurological:     Mental Status: She is alert.      Assessment/Plan: Please see individual problem list.  Attention deficit hyperactivity disorder (ADHD) We will switch her morning Adderall to the extended release version.  We will follow up in about a month.  If she notices palpitations or other side effects she will let us know.  Suspect she had sinus arrhythmia that led to the sensation that she  described.  She will monitor for any recurrence.   No orders of the defined types were placed in this encounter.   Meds ordered this encounter  Medications  . amphetamine-dextroamphetamine (ADDERALL XR) 15 MG 24 hr capsule    Sig: Take 1 capsule by mouth every morning.    Dispense:  30 capsule    Refill:  0    This visit occurred during the SARS-CoV-2 public health emergency.  Safety protocols were in place, including screening questions prior to the visit, additional usage of staff PPE, and extensive cleaning of exam room while observing appropriate contact time as indicated for disinfecting solutions.    Marikay Alar, MD [facility]

## 2020-05-07 NOTE — Assessment & Plan Note (Signed)
We will switch her morning Adderall to the extended release version.  We will follow up in about a month.  If she notices palpitations or other side effects she will let us know.  Suspect she had sinus arrhythmia that led to the sensation that she described.  She will monitor for any recurrence.

## 2020-05-09 ENCOUNTER — Telehealth: Payer: Self-pay | Admitting: Family Medicine

## 2020-05-09 NOTE — Telephone Encounter (Signed)
Patient called in stated that the pharmacy need Dr.Sonnenberg to authorize with insurance that she needs  The medicationamphetamine-dextroamphetamine (ADDERALL XR) 15 MG 24 hr capsule

## 2020-05-09 NOTE — Telephone Encounter (Signed)
Please call the pharmacy and see what the issue with this is.  Thanks.

## 2020-05-12 ENCOUNTER — Telehealth: Payer: Self-pay

## 2020-05-12 NOTE — Telephone Encounter (Signed)
I started a PA for the Adderall XR 15 mg for the patient and the response I received was the the patient did not match with the insurance, I called [employer] and they stated that the patient's insurance coverage ended on 04/12/2020 and she needed to re-enroll for the fall semester, I called and left a VM informing the patient of this and to call back with any questions.  Tanette Chauca,cma

## 2020-05-13 NOTE — Telephone Encounter (Signed)
I had to do a PA and it came back stating the patient had no insurance, she had student blue so I called [employer] student blue and they stated she did not have insurance for the fall. I called and spoke with the patient and she stated she had a picture of the card, her mom had added her to her insurance so she is bringing her new insurance card by today for the office to have it and I will put the information in.  Krista Mcdonald,cma

## 2020-05-13 NOTE — Telephone Encounter (Signed)
Noted. Please see if the pharmacy can re-run it with her new insurance information.

## 2020-05-15 NOTE — Telephone Encounter (Signed)
I had to call [facility] for the PA and it was approved for the medication. The pharmacy and the patient was notified.  Krista Mcdonald,cma

## 2020-06-17 ENCOUNTER — Ambulatory Visit: Payer: BC Managed Care – PPO | Admitting: Family Medicine

## 2020-06-18 ENCOUNTER — Ambulatory Visit (INDEPENDENT_AMBULATORY_CARE_PROVIDER_SITE_OTHER): Payer: PRIVATE HEALTH INSURANCE | Admitting: Family Medicine

## 2020-06-18 ENCOUNTER — Encounter: Payer: Self-pay | Admitting: Family Medicine

## 2020-06-18 ENCOUNTER — Telehealth: Payer: Self-pay

## 2020-06-18 ENCOUNTER — Other Ambulatory Visit: Payer: Self-pay

## 2020-06-18 VITALS — BP 90/60 | HR 92 | Temp 98.1°F | Ht 63.0 in | Wt 106.6 lb

## 2020-06-18 DIAGNOSIS — Z23 Encounter for immunization: Secondary | ICD-10-CM | POA: Diagnosis not present

## 2020-06-18 DIAGNOSIS — I498 Other specified cardiac arrhythmias: Secondary | ICD-10-CM | POA: Diagnosis not present

## 2020-06-18 DIAGNOSIS — F909 Attention-deficit hyperactivity disorder, unspecified type: Secondary | ICD-10-CM | POA: Diagnosis not present

## 2020-06-18 MED ORDER — AMPHETAMINE-DEXTROAMPHET ER 15 MG PO CP24: 15.0000 mg | ORAL_CAPSULE | ORAL | 0 refills | Status: DC

## 2020-06-18 MED ORDER — AMPHETAMINE-DEXTROAMPHETAMINE 5 MG PO TABS: 5.0000 mg | ORAL_TABLET | Freq: Every day | ORAL | 0 refills | Status: DC

## 2020-06-18 NOTE — Progress Notes (Signed)
  Krista Alar, MD Phone: [phone]  Krista Mcdonald is a 24 y.o. female who presents today for f/u.  ADHD Medication: adderall XR 15 mg daily, adderall IR 5 mg daily prn Effectiveness: yes, XR dose lasts longer in the day and that is helpful Palpitations: so sinus arrhythmia that she notices the day after long runs Sleep difficulty: only if she takes her meds too late Appetite suppression: no  Sinus arrhythmia: Patient notes typically the day after a longer on she will notice that her heart rate will increase when she breathes in and it will slow down when she breathes out.  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   Social History   Tobacco Use  Smoking Status Never Smoker  Smokeless Tobacco Never Used     ROS see history of present illness  Objective  Physical Exam Vitals:   06/18/20 1537  BP: 90/60  Pulse: 92  Temp: 98.1 F (36.7 C)  SpO2: 99%    BP Readings from Last 3 Encounters:  06/18/20 90/60  05/07/20 110/76  03/24/20 100/60   Wt Readings from Last 3 Encounters:  06/18/20 106 lb 9.6 oz (48.4 kg)  05/07/20 105 lb 12.8 oz (48 kg)  03/24/20 105 lb 12.8 oz (48 kg)    Physical Exam Constitutional:      General: She is not in acute distress.    Appearance: She is not diaphoretic.  Cardiovascular:     Rate and Rhythm: Normal rate.     Heart sounds: Normal heart sounds.     Comments: Respiratory sinus arrhythmia noted Pulmonary:     Effort: Pulmonary effort is normal.     Breath sounds: Normal breath sounds.  Skin:    General: Skin is warm and dry.  Neurological:     Mental Status: She is alert.      Assessment/Plan: Please see individual problem list.  Problem List Items Addressed This Visit    Attention deficit hyperactivity disorder (ADHD)    Patient has responded well to the current regimen.  We will provide 3 months of refills of her Adderall XR 15 mg once daily.      Relevant Medications    amphetamine-dextroamphetamine (ADDERALL XR) 15 MG 24 hr capsule (Start on 08/18/2020)   amphetamine-dextroamphetamine (ADDERALL XR) 15 MG 24 hr capsule (Start on 07/19/2020)   amphetamine-dextroamphetamine (ADDERALL) 5 MG tablet (Start on 08/18/2020)   amphetamine-dextroamphetamine (ADDERALL) 5 MG tablet (Start on 07/19/2020)   amphetamine-dextroamphetamine (ADDERALL) 5 MG tablet   amphetamine-dextroamphetamine (ADDERALL XR) 15 MG 24 hr capsule   Sinus arrhythmia    Patient with respiratory sinus arrhythmia noted on exam.  Discussed benign nature of this.  She will monitor and if she develops any palpitations, chest pain, or other symptoms she will let us know.       Other Visit Diagnoses    Need for immunization against influenza    -  Primary   Relevant Orders   Flu Vaccine QUAD 36+ mos IM (Completed)        This visit occurred during the SARS-CoV-2 public health emergency.  Safety protocols were in place, including screening questions prior to the visit, additional usage of staff PPE, and extensive cleaning of exam room while observing appropriate contact time as indicated for disinfecting solutions.    Krista Alar, MD [facility]

## 2020-06-18 NOTE — Assessment & Plan Note (Signed)
Patient has responded well to the current regimen.  We will provide 3 months of refills of her Adderall XR 15 mg once daily.

## 2020-06-18 NOTE — Patient Instructions (Signed)
Nice to see you. I have refilled your Adderall. Please let us know if you develop any palpitations, chest pain, or other symptoms.

## 2020-06-18 NOTE — Assessment & Plan Note (Signed)
Patient with respiratory sinus arrhythmia noted on exam.  Discussed benign nature of this.  She will monitor and if she develops any palpitations, chest pain, or other symptoms she will let us know.

## 2020-06-18 NOTE — Telephone Encounter (Signed)
Patient received an PA approval for amphet/detr cap 15 mg from 05/15/2020-05/15/2021.  Krista Mcdonald,cma

## 2020-07-18 ENCOUNTER — Other Ambulatory Visit: Payer: Self-pay

## 2020-10-22 ENCOUNTER — Encounter: Payer: Self-pay | Admitting: Family Medicine

## 2020-10-22 ENCOUNTER — Other Ambulatory Visit: Payer: Self-pay

## 2020-10-22 ENCOUNTER — Ambulatory Visit (INDEPENDENT_AMBULATORY_CARE_PROVIDER_SITE_OTHER): Payer: PRIVATE HEALTH INSURANCE | Admitting: Family Medicine

## 2020-10-22 DIAGNOSIS — F909 Attention-deficit hyperactivity disorder, unspecified type: Secondary | ICD-10-CM

## 2020-10-22 MED ORDER — AMPHETAMINE-DEXTROAMPHET ER 15 MG PO CP24: 15.0000 mg | ORAL_CAPSULE | ORAL | 0 refills | Status: DC

## 2020-10-22 MED ORDER — AMPHETAMINE-DEXTROAMPHETAMINE 5 MG PO TABS: 5.0000 mg | ORAL_TABLET | Freq: Every day | ORAL | 0 refills | Status: DC

## 2020-10-22 NOTE — Assessment & Plan Note (Signed)
Well-controlled.  Her heart rate is minimally elevated today though did come down into the normal range on recheck.  She notes no palpitations.  She does note generally her heart rate seems to be in the normal range most of the time at home though she does not check frequently.  We will bring her back in 1 week to recheck her heart rate.  She will continue Adderall XR 15 mg once daily and the Adderall immediate release 5 mg daily as needed.  Refills given.  Controlled substance database reviewed.

## 2020-10-22 NOTE — Progress Notes (Signed)
Marikay Alar, MD Phone: [phone]  Krista Mcdonald is a 25 y.o. female who presents today for f/u.  ADHD Medication: adderall XR 15 mg daily, adderall IR 5 mg daily prn Effectiveness: yes Palpitations: no Sleep difficulty: no Appetite suppression: minimal   Social History   Tobacco Use  Smoking Status Never Smoker  Smokeless Tobacco Never Used    Current Outpatient Medications on File Prior to Visit  Medication Sig Dispense Refill  . ISOtretinoin (ACCUTANE) 40 MG capsule Take 40 mg by mouth 2 (two) times daily.    Marland Kitchen levonorgestrel (LILETTA, 52 MG,) 19.5 MCG/DAY IUD IUD 1 each by Intrauterine route once.    . cyclobenzaprine (FLEXERIL) 5 MG tablet Take 1 tablet (5 mg total) by mouth at bedtime as needed for muscle spasms. (Patient not taking: Reported on 10/22/2020) 30 tablet 2  . spironolactone (ALDACTONE) 100 MG tablet Take 100 mg by mouth daily. (Patient not taking: Reported on 10/22/2020)     No current facility-administered medications on file prior to visit.     ROS see history of present illness  Objective  Physical Exam Vitals:   10/22/20 0910 10/22/20 0929  BP: 90/60   Pulse: (!) 108 96  Temp: 98.7 F (37.1 C)   SpO2: 99%     BP Readings from Last 3 Encounters:  10/22/20 90/60  06/18/20 90/60  05/07/20 110/76   Wt Readings from Last 3 Encounters:  10/22/20 106 lb (48.1 kg)  06/18/20 106 lb 9.6 oz (48.4 kg)  05/07/20 105 lb 12.8 oz (48 kg)    Physical Exam Constitutional:      General: She is not in acute distress.    Appearance: She is not diaphoretic.  Cardiovascular:     Rate and Rhythm: Regular rhythm. Tachycardia present.     Heart sounds: Normal heart sounds.  Pulmonary:     Effort: Pulmonary effort is normal.     Breath sounds: Normal breath sounds.  Musculoskeletal:        General: No edema.  Skin:    General: Skin is warm and dry.  Neurological:     Mental Status: She is alert.      Assessment/Plan: Please see  individual problem list.  Problem List Items Addressed This Visit    Attention deficit hyperactivity disorder (ADHD)    Well-controlled.  Her heart rate is minimally elevated today though did come down into the normal range on recheck.  She notes no palpitations.  She does note generally her heart rate seems to be in the normal range most of the time at home though she does not check frequently.  We will bring her back in 1 week to recheck her heart rate.  She will continue Adderall XR 15 mg once daily and the Adderall immediate release 5 mg daily as needed.  Refills given.  Controlled substance database reviewed.      Relevant Medications   amphetamine-dextroamphetamine (ADDERALL XR) 15 MG 24 hr capsule (Start on 01/13/2021)   amphetamine-dextroamphetamine (ADDERALL XR) 15 MG 24 hr capsule (Start on 12/14/2020)   amphetamine-dextroamphetamine (ADDERALL XR) 15 MG 24 hr capsule (Start on 11/13/2020)   amphetamine-dextroamphetamine (ADDERALL) 5 MG tablet (Start on 01/13/2021)   amphetamine-dextroamphetamine (ADDERALL) 5 MG tablet (Start on 12/14/2020)   amphetamine-dextroamphetamine (ADDERALL) 5 MG tablet (Start on 11/13/2020)       This visit occurred during the SARS-CoV-2 public health emergency.  Safety protocols were in place, including screening questions prior to the visit, additional usage of staff PPE,  and extensive cleaning of exam room while observing appropriate contact time as indicated for disinfecting solutions.    Tommi Rumps, MD [place]

## 2020-10-22 NOTE — Patient Instructions (Signed)
Nice to see you. Please try to check your heart rate at home.  If it is running greater than 100 please let us know.

## 2020-10-29 ENCOUNTER — Ambulatory Visit: Payer: PRIVATE HEALTH INSURANCE | Admitting: *Deleted

## 2020-10-29 ENCOUNTER — Other Ambulatory Visit: Payer: Self-pay

## 2020-10-29 VITALS — HR 83 | Temp 97.9°F

## 2020-10-29 DIAGNOSIS — I498 Other specified cardiac arrhythmias: Secondary | ICD-10-CM

## 2020-10-29 NOTE — Progress Notes (Signed)
Patient in to day for pulse check ordered from visit 10/22/20 on that visit pulse 96 today pulse 83. Patient denied any flutters, palpitations , or feeling of heart racing.

## 2021-01-15 ENCOUNTER — Other Ambulatory Visit: Payer: Self-pay

## 2021-01-21 ENCOUNTER — Encounter: Payer: Self-pay | Admitting: Family Medicine

## 2021-01-21 ENCOUNTER — Other Ambulatory Visit: Payer: Self-pay

## 2021-01-21 ENCOUNTER — Ambulatory Visit (INDEPENDENT_AMBULATORY_CARE_PROVIDER_SITE_OTHER): Payer: PRIVATE HEALTH INSURANCE | Admitting: Family Medicine

## 2021-01-21 DIAGNOSIS — F909 Attention-deficit hyperactivity disorder, unspecified type: Secondary | ICD-10-CM

## 2021-01-21 MED ORDER — AMPHETAMINE-DEXTROAMPHET ER 15 MG PO CP24: 15.0000 mg | ORAL_CAPSULE | ORAL | 0 refills | Status: DC

## 2021-01-21 NOTE — Progress Notes (Signed)
I called the pharmacy and they stated the patient had 3 prescriptions on file and  they agreed to release one of them for the patient.  Cabot Cromartie,cma

## 2021-01-21 NOTE — Patient Instructions (Signed)
Nice to see you. I have refilled your Adderall.

## 2021-01-21 NOTE — Progress Notes (Signed)
Marikay Alar, MD Phone: [phone]  Krista Mcdonald is a 25 y.o. female who presents today for f/u.  ADHD Medication: adderall XR 15 mg once daily, takes adderall IR 5 mg in the afternoon as needed Effectiveness: yes Palpitations: no Sleep difficulty: no Appetite suppression: no   Social History   Tobacco Use  Smoking Status Never Smoker  Smokeless Tobacco Never Used    Current Outpatient Medications on File Prior to Visit  Medication Sig Dispense Refill  . amphetamine-dextroamphetamine (ADDERALL) 5 MG tablet Take 1 tablet (5 mg total) by mouth daily after lunch. 30 tablet 0  . amphetamine-dextroamphetamine (ADDERALL) 5 MG tablet Take 1 tablet (5 mg total) by mouth daily after lunch. 30 tablet 0  . amphetamine-dextroamphetamine (ADDERALL) 5 MG tablet Take 1 tablet (5 mg total) by mouth daily after lunch. 30 tablet 0  . cyclobenzaprine (FLEXERIL) 5 MG tablet Take 1 tablet (5 mg total) by mouth at bedtime as needed for muscle spasms. 30 tablet 2  . ibuprofen (ADVIL) 800 MG tablet Take 800 mg by mouth every 8 (eight) hours as needed.    . ISOtretinoin (ACCUTANE) 40 MG capsule Take 40 mg by mouth 2 (two) times daily.    Marland Kitchen levonorgestrel (LILETTA, 52 MG,) 19.5 MCG/DAY IUD IUD 1 each by Intrauterine route once.    Marland Kitchen spironolactone (ALDACTONE) 100 MG tablet Take 100 mg by mouth daily.     No current facility-administered medications on file prior to visit.     ROS see history of present illness  Objective  Physical Exam Vitals:   01/21/21 0809  BP: 90/60  Pulse: 88  Temp: 98 F (36.7 C)  SpO2: 99%    BP Readings from Last 3 Encounters:  01/21/21 90/60  10/22/20 90/60  06/18/20 90/60   Wt Readings from Last 3 Encounters:  01/21/21 104 lb 9.6 oz (47.4 kg)  10/22/20 106 lb (48.1 kg)  06/18/20 106 lb 9.6 oz (48.4 kg)    Physical Exam Constitutional:      General: She is not in acute distress.    Appearance: She is not diaphoretic.  Cardiovascular:      Rate and Rhythm: Normal rate and regular rhythm.     Heart sounds: Normal heart sounds.  Pulmonary:     Effort: Pulmonary effort is normal.     Breath sounds: Normal breath sounds.  Skin:    General: Skin is warm and dry.  Neurological:     Mental Status: She is alert.      Assessment/Plan: Please see individual problem list.  Problem List Items Addressed This Visit    Attention deficit hyperactivity disorder (ADHD)    Chronic issue.  Stable.  We will continue Adderall XR 15 mg once daily.  She will continue on Adderall 5 mg in the afternoon as needed.  I will have the CMA contact the pharmacy to see how many prescription she has on file regarding the Adderall 5 mg tablets.      Relevant Medications   amphetamine-dextroamphetamine (ADDERALL XR) 15 MG 24 hr capsule (Start on 04/09/2021)   amphetamine-dextroamphetamine (ADDERALL XR) 15 MG 24 hr capsule (Start on 03/10/2021)   amphetamine-dextroamphetamine (ADDERALL XR) 15 MG 24 hr capsule (Start on 02/07/2021)      Return in about 3 months (around 04/23/2021) for ADHD.  This visit occurred during the SARS-CoV-2 public health emergency.  Safety protocols were in place, including screening questions prior to the visit, additional usage of staff PPE, and extensive cleaning of exam  room while observing appropriate contact time as indicated for disinfecting solutions.    Tommi Rumps, MD [place]

## 2021-01-21 NOTE — Assessment & Plan Note (Signed)
Chronic issue.  Stable.  We will continue Adderall XR 15 mg once daily.  She will continue on Adderall 5 mg in the afternoon as needed.  I will have the CMA contact the pharmacy to see how many prescription she has on file regarding the Adderall 5 mg tablets.

## 2021-04-24 ENCOUNTER — Encounter: Payer: Self-pay | Admitting: Family Medicine

## 2021-04-24 ENCOUNTER — Ambulatory Visit (INDEPENDENT_AMBULATORY_CARE_PROVIDER_SITE_OTHER): Payer: PRIVATE HEALTH INSURANCE | Admitting: Family Medicine

## 2021-04-24 ENCOUNTER — Other Ambulatory Visit: Payer: Self-pay

## 2021-04-24 DIAGNOSIS — F909 Attention-deficit hyperactivity disorder, unspecified type: Secondary | ICD-10-CM

## 2021-04-24 MED ORDER — AMPHETAMINE-DEXTROAMPHET ER 15 MG PO CP24: 15.0000 mg | ORAL_CAPSULE | ORAL | 0 refills | Status: DC

## 2021-04-24 NOTE — Assessment & Plan Note (Signed)
Stable.  She will continue Adderall XR 15 mg once daily.  She can continue the Adderall 5 mg dose as needed.  Follow-up in 3 months.

## 2021-04-24 NOTE — Patient Instructions (Signed)
Nice to see you. We will continue with your adderall. If you notice any issues with taking this medication please let me know.

## 2021-04-24 NOTE — Progress Notes (Signed)
Marikay Alar, MD Phone: [phone]  Krista Mcdonald is a 25 y.o. female who presents today for f/u.  ADHD Medication: taking adderall XR 15 mg daily and adderall 5 mg daily prn, typically only takes the 5 mg dose when she is in school Effectiveness: yes Palpitations: no Sleep difficulty: no Appetite suppression: no   Social History   Tobacco Use  Smoking Status Never  Smokeless Tobacco Never    Current Outpatient Medications on File Prior to Visit  Medication Sig Dispense Refill   amphetamine-dextroamphetamine (ADDERALL) 5 MG tablet Take 1 tablet (5 mg total) by mouth daily after lunch. 30 tablet 0   amphetamine-dextroamphetamine (ADDERALL) 5 MG tablet Take 1 tablet (5 mg total) by mouth daily after lunch. 30 tablet 0   amphetamine-dextroamphetamine (ADDERALL) 5 MG tablet Take 1 tablet (5 mg total) by mouth daily after lunch. 30 tablet 0   cyclobenzaprine (FLEXERIL) 5 MG tablet Take 1 tablet (5 mg total) by mouth at bedtime as needed for muscle spasms. 30 tablet 2   ibuprofen (ADVIL) 800 MG tablet Take 800 mg by mouth every 8 (eight) hours as needed.     ISOtretinoin (ACCUTANE) 40 MG capsule Take 40 mg by mouth 2 (two) times daily. (Patient not taking: Reported on 04/24/2021)     levonorgestrel (LILETTA, 52 MG,) 19.5 MCG/DAY IUD IUD 1 each by Intrauterine route once.     spironolactone (ALDACTONE) 100 MG tablet Take 100 mg by mouth daily. (Patient not taking: Reported on 04/24/2021)     No current facility-administered medications on file prior to visit.     ROS see history of present illness  Objective  Physical Exam Vitals:   04/24/21 0811  BP: 108/70  Pulse: 88  Resp: 16  Temp: 98 F (36.7 C)  SpO2: 99%    BP Readings from Last 3 Encounters:  04/24/21 108/70  01/21/21 90/60  10/22/20 90/60   Wt Readings from Last 3 Encounters:  04/24/21 106 lb (48.1 kg)  01/21/21 104 lb 9.6 oz (47.4 kg)  10/22/20 106 lb (48.1 kg)    Physical Exam Constitutional:       General: She is not in acute distress.    Appearance: She is not diaphoretic.  Cardiovascular:     Rate and Rhythm: Normal rate.     Heart sounds: Normal heart sounds.     Comments: Sinus arrhythmia noted with patient breathing in and out Pulmonary:     Effort: Pulmonary effort is normal.     Breath sounds: Normal breath sounds.  Skin:    General: Skin is warm and dry.  Neurological:     Mental Status: She is alert.     Assessment/Plan: Please see individual problem list.  Problem List Items Addressed This Visit     Attention deficit hyperactivity disorder (ADHD) (Chronic)    Stable.  She will continue Adderall XR 15 mg once daily.  She can continue the Adderall 5 mg dose as needed.  Follow-up in 3 months.      Relevant Medications   amphetamine-dextroamphetamine (ADDERALL XR) 15 MG 24 hr capsule (Start on 06/24/2021)   amphetamine-dextroamphetamine (ADDERALL XR) 15 MG 24 hr capsule (Start on 05/25/2021)   amphetamine-dextroamphetamine (ADDERALL XR) 15 MG 24 hr capsule   Return in about 3 months (around 07/25/2021) for ADHD.  This visit occurred during the SARS-CoV-2 public health emergency.  Safety protocols were in place, including screening questions prior to the visit, additional usage of staff PPE, and extensive cleaning of exam  room while observing appropriate contact time as indicated for disinfecting solutions.    Tommi Rumps, MD [place]

## 2021-07-29 ENCOUNTER — Ambulatory Visit: Payer: PRIVATE HEALTH INSURANCE | Admitting: Family Medicine

## 2021-09-01 ENCOUNTER — Other Ambulatory Visit: Payer: Self-pay

## 2021-09-01 ENCOUNTER — Encounter: Payer: Self-pay | Admitting: Family Medicine

## 2021-09-01 ENCOUNTER — Ambulatory Visit (INDEPENDENT_AMBULATORY_CARE_PROVIDER_SITE_OTHER): Payer: PRIVATE HEALTH INSURANCE | Admitting: Family Medicine

## 2021-09-01 VITALS — BP 110/70 | HR 62 | Temp 97.7°F | Ht 63.0 in | Wt 104.0 lb

## 2021-09-01 DIAGNOSIS — M5441 Lumbago with sciatica, right side: Secondary | ICD-10-CM | POA: Diagnosis not present

## 2021-09-01 DIAGNOSIS — F909 Attention-deficit hyperactivity disorder, unspecified type: Secondary | ICD-10-CM | POA: Diagnosis not present

## 2021-09-01 DIAGNOSIS — M2550 Pain in unspecified joint: Secondary | ICD-10-CM | POA: Diagnosis not present

## 2021-09-01 DIAGNOSIS — M791 Myalgia, unspecified site: Secondary | ICD-10-CM

## 2021-09-01 LAB — COMPREHENSIVE METABOLIC PANEL
ALT: 5 U/L (ref 0–35)
AST: 11 U/L (ref 0–37)
Albumin: 4.3 g/dL (ref 3.5–5.2)
Alkaline Phosphatase: 69 U/L (ref 39–117)
BUN: 18 mg/dL (ref 6–23)
CO2: 28 mEq/L (ref 19–32)
Calcium: 9.1 mg/dL (ref 8.4–10.5)
Chloride: 104 mEq/L (ref 96–112)
Creatinine, Ser: 0.64 mg/dL (ref 0.40–1.20)
GFR: 124.24 mL/min (ref >60.00)
Glucose, Bld: 84 mg/dL (ref 70–99)
Potassium: 4.2 mEq/L (ref 3.5–5.1)
Sodium: 136 mEq/L (ref 135–145)
Total Bilirubin: 0.8 mg/dL (ref 0.2–1.2)
Total Protein: 6.2 g/dL (ref 6.0–8.3)

## 2021-09-01 LAB — CK: Total CK: 62 U/L (ref 7–177)

## 2021-09-01 LAB — CBC
HCT: 41.2 % (ref 36.0–46.0)
Hemoglobin: 13.5 g/dL (ref 12.0–15.0)
MCHC: 32.7 g/dL (ref 30.0–36.0)
MCV: 93.1 fl (ref 78.0–100.0)
Platelets: 217 10*3/uL (ref 150.0–400.0)
RBC: 4.43 Mil/uL (ref 3.87–5.11)
RDW: 12.9 % (ref 11.5–15.5)
WBC: 5.9 10*3/uL (ref 4.0–10.5)

## 2021-09-01 MED ORDER — IBUPROFEN 800 MG PO TABS: 800.0000 mg | ORAL_TABLET | Freq: Three times a day (TID) | ORAL | 0 refills | Status: DC | PRN

## 2021-09-01 MED ORDER — CYCLOBENZAPRINE HCL 5 MG PO TABS
5.0000 mg | ORAL_TABLET | Freq: Every evening | ORAL | 0 refills | Status: DC | PRN
Start: 2021-09-01 — End: 2024-06-18

## 2021-09-01 NOTE — Progress Notes (Signed)
Tommi Rumps, MD Phone: [phone]  Krista Mcdonald is a 25 y.o. female who presents today for f/u.  ADHD Medication: taking adderall XR 15 mg 5 days a week, has not been taking the 5 mg dose in the afternoon Effectiveness: yes Palpitations: only if she has fasted and is at the end of the fast Sleep difficulty: no Appetite suppression: no  Chronic joint pains/low back pain: Patient notes this is an ongoing issue.  She attributes the widespread joint and muscle discomfort to the Accutane.  She notes her dermatologist is aware of this.  The patient has been taking ibuprofen on a daily basis recently.  She has run out of her Flexeril.  She does note pain radiating from her right low back down into her right thigh.  She notes her feet get numb when it is cold outside and her toes turn white and then red when they warm back up.  She notes her legs do feel little weak though she has not been working out.  No incontinence.  No saddle anesthesia.   Social History   Tobacco Use  Smoking Status Never  Smokeless Tobacco Never    Current Outpatient Medications on File Prior to Visit  Medication Sig Dispense Refill   amphetamine-dextroamphetamine (ADDERALL XR) 15 MG 24 hr capsule Take 1 capsule by mouth every morning. 30 capsule 0   amphetamine-dextroamphetamine (ADDERALL XR) 15 MG 24 hr capsule Take 1 capsule by mouth every morning. 30 capsule 0   amphetamine-dextroamphetamine (ADDERALL XR) 15 MG 24 hr capsule Take 1 capsule by mouth every morning. 30 capsule 0   levonorgestrel (LILETTA, 52 MG,) 19.5 MCG/DAY IUD IUD 1 each by Intrauterine route once.     No current facility-administered medications on file prior to visit.     ROS see history of present illness  Objective  Physical Exam Vitals:   09/01/21 0808  BP: 110/70  Pulse: 62  Temp: 97.7 F (36.5 C)  SpO2: 99%    BP Readings from Last 3 Encounters:  09/01/21 110/70  04/24/21 108/70  01/21/21 90/60   Wt  Readings from Last 3 Encounters:  09/01/21 104 lb (47.2 kg)  04/24/21 106 lb (48.1 kg)  01/21/21 104 lb 9.6 oz (47.4 kg)    Physical Exam Constitutional:      General: She is not in acute distress.    Appearance: She is not diaphoretic.  Cardiovascular:     Rate and Rhythm: Normal rate and regular rhythm.     Heart sounds: Normal heart sounds.  Pulmonary:     Effort: Pulmonary effort is normal.     Breath sounds: Normal breath sounds.  Musculoskeletal:     Comments: No midline spine tenderness, no midline spine step-off, no muscular back tenderness  Skin:    General: Skin is warm and dry.  Neurological:     Mental Status: She is alert.     Comments: 5/5 strength bilateral quads, hamstrings, plantar flexion, and dorsiflexion, sensation to light touch intact bilateral lower extremities     Assessment/Plan: Please see individual problem list.  Problem List Items Addressed This Visit     Attention deficit hyperactivity disorder (ADHD) (Chronic)    Generally stable.  I have advised her not to take her Adderall if she is going to fast.  We will continue Adderall XR 15 mg once daily.  She will let me know when she needs refills.      Arthralgia    The patient has had ongoing issues  with arthralgias and back pain.  She also has what seems to be Raynaud's syndrome.  Given this combination I think she does warrant at least an initial work-up for a rheumatologic issue.  Lab work will be completed.  Certainly the pain issues could be related to her Accutane though that would not explain the Raynaud's phenomenon.      Relevant Medications   ibuprofen (ADVIL) 800 MG tablet   Other Relevant Orders   Comp Met (CMET)   CBC   Rheumatoid Factor   Cyclic citrul peptide antibody, IgG ([facility])   Antinuclear Antib (ANA)   Other Visit Diagnoses     Myalgia    -  Primary   Relevant Orders   CK (Creatine Kinase)   Acute right-sided low back pain with right-sided sciatica       Relevant  Medications   cyclobenzaprine (FLEXERIL) 5 MG tablet   ibuprofen (ADVIL) 800 MG tablet       Return in about 3 months (around 11/30/2021) for adhd.  This visit occurred during the SARS-CoV-2 public health emergency.  Safety protocols were in place, including screening questions prior to the visit, additional usage of staff PPE, and extensive cleaning of exam room while observing appropriate contact time as indicated for disinfecting solutions.    Tommi Rumps, MD [place]

## 2021-09-01 NOTE — Assessment & Plan Note (Signed)
The patient has had ongoing issues with arthralgias and back pain.  She also has what seems to be Raynaud's syndrome.  Given this combination I think she does warrant at least an initial work-up for a rheumatologic issue.  Lab work will be completed.  Certainly the pain issues could be related to her Accutane though that would not explain the Raynaud's phenomenon.

## 2021-09-01 NOTE — Assessment & Plan Note (Signed)
Generally stable.  I have advised her not to take her Adderall if she is going to fast.  We will continue Adderall XR 15 mg once daily.  She will let me know when she needs refills.

## 2021-09-01 NOTE — Progress Notes (Signed)
ol ?

## 2021-09-01 NOTE — Patient Instructions (Signed)
Nice to see you. We're going to get lab work today.  We will contact you with results. Please do not take your Adderall when you are fasting. When you need a refill of the Adderall please let me know.

## 2021-09-02 LAB — ANA: Anti Nuclear Antibody (ANA): NEGATIVE

## 2021-09-02 LAB — RHEUMATOID FACTOR: Rheumatoid fact SerPl-aCnc: <14 IU/mL (ref <14)

## 2021-09-02 LAB — CYCLIC CITRUL PEPTIDE ANTIBODY, IGG: Cyclic Citrullin Peptide Ab: <16 UNITS

## 2021-11-09 ENCOUNTER — Telehealth: Payer: Self-pay | Admitting: Family Medicine

## 2021-11-09 DIAGNOSIS — F909 Attention-deficit hyperactivity disorder, unspecified type: Secondary | ICD-10-CM

## 2021-11-09 MED ORDER — AMPHETAMINE-DEXTROAMPHET ER 15 MG PO CP24: 15.0000 mg | ORAL_CAPSULE | ORAL | 0 refills | Status: DC

## 2021-11-09 NOTE — Telephone Encounter (Signed)
I sent the Adderall XR dose in. At her last visit she reported she was not taking the 5 mg dose any more. Please confirm if she has been taking that or if she feels she needs that dose in the afternoon. Thanks.

## 2021-11-09 NOTE — Telephone Encounter (Signed)
Pt called in requesting for refill on medication (amphetamine-dextroamphetamine (ADDERALL XR) 15 MG 24 hr capsule) and (amphetamine-dextroamphetamine (ADDERALL) 5 MG tablet). Pt requesting callback.

## 2021-11-09 NOTE — Telephone Encounter (Signed)
I called and LVM for patient to call back for a confirmation of dose of medication.  Krista Mcdonald,cma

## 2021-11-13 MED ORDER — AMPHETAMINE-DEXTROAMPHETAMINE 5 MG PO TABS: 5.0000 mg | ORAL_TABLET | Freq: Every day | ORAL | 0 refills | Status: DC | PRN

## 2021-11-13 NOTE — Telephone Encounter (Signed)
Pt called in stating that she will take the 10mg   tablet instead of just the 5mg  tablet.  ?

## 2021-11-13 NOTE — Telephone Encounter (Signed)
Pt called in stating that she went to her prefer pharmacy to pick up medication (amphetamine-dextroamphetamine (ADDERALL) 5 MG tablet). Pt stated that the pharmacy advise her that the medication at this time is not avail. Pt stated that pharmacy advise her that there is a shortage on medication. Pt was wondering if there is another medication that Dr. Birdie Sons can prescribe to her. Pt requesting callback  ?

## 2021-11-13 NOTE — Telephone Encounter (Signed)
Pt called in stating that they have the medication that she need at [facility] [place] and she want the prescription transferred over there ?

## 2021-11-13 NOTE — Telephone Encounter (Signed)
Sent to pharmacy 

## 2021-11-13 NOTE — Telephone Encounter (Signed)
I called and spoke with the patient and she stated she does take the 5 mg in the afternoon, so she does need a prescription for it.  Mende [place],cma  ?

## 2021-11-13 NOTE — Addendum Note (Signed)
Addended by: Glori Luis on: 11/13/2021 10:19 AM ? ? Modules accepted: Orders ? ?

## 2021-11-14 MED ORDER — AMPHETAMINE-DEXTROAMPHETAMINE 10 MG PO TABS: 5.0000 mg | ORAL_TABLET | Freq: Every day | ORAL | 0 refills | Status: DC

## 2021-11-14 NOTE — Addendum Note (Signed)
Addended by: Glori Luis on: 11/14/2021 09:49 AM ? ? Modules accepted: Orders ? ?

## 2021-11-14 NOTE — Telephone Encounter (Signed)
Sent to pharmacy 

## 2021-11-16 MED ORDER — AMPHETAMINE-DEXTROAMPHETAMINE 5 MG PO TABS: 5.0000 mg | ORAL_TABLET | Freq: Every day | ORAL | 0 refills | Status: DC | PRN

## 2021-11-16 NOTE — Addendum Note (Signed)
Addended by: Fulton Mole D on: 11/16/2021 04:41 PM ? ? Modules accepted: Orders ? ?

## 2021-11-16 NOTE — Telephone Encounter (Signed)
The 10 mg was suppose to be for the mother not the daughter, I discontinued the medication and I called the pharmacy and informed them to delete that dosage [facility] not [facility] the 10 mg  fo her their. She is requesting the 5 mg to go [facility]. I think the fornt is who did this incorrectly when they sent the message to me, it was the mom who wanted the 10 mg.   Krista Mcdonald,cma  ?

## 2021-11-16 NOTE — Addendum Note (Signed)
Addended by: Glori Luis on: 11/16/2021 04:57 PM ? ? Modules accepted: Orders ? ?

## 2021-11-16 NOTE — Telephone Encounter (Signed)
Pt need refill on adderall 5mg  [facility] [place] ?

## 2021-11-16 NOTE — Telephone Encounter (Signed)
Adderall sent to the pharmacy 

## 2021-11-17 MED ORDER — AMPHETAMINE-DEXTROAMPHET ER 15 MG PO CP24: 15.0000 mg | ORAL_CAPSULE | ORAL | 0 refills | Status: DC

## 2021-11-17 NOTE — Addendum Note (Signed)
Addended by: Glori Luis on: 11/17/2021 03:26 PM ? ? Modules accepted: Orders ? ?

## 2021-11-17 NOTE — Telephone Encounter (Signed)
Sent to pharmacy 

## 2021-11-17 NOTE — Telephone Encounter (Signed)
Patient's pharmacy does not have the amphetamine-dextroamphetamine (ADDERALL XR) 15 MG 24 hr capsule. [facility] [place] does [facility] and to please send there. ?

## 2021-12-01 MED ORDER — AMPHETAMINE-DEXTROAMPHET ER 15 MG PO CP24: 15.0000 mg | ORAL_CAPSULE | ORAL | 0 refills | Status: DC

## 2021-12-01 NOTE — Telephone Encounter (Signed)
Sent to pharmacy 

## 2021-12-01 NOTE — Addendum Note (Signed)
Addended by: Leone Haven on: 12/01/2021 03:12 PM ? ? Modules accepted: Orders ? ?

## 2021-12-01 NOTE — Telephone Encounter (Signed)
Does she want them [facility] to [facility] [place]? ?

## 2021-12-01 NOTE — Telephone Encounter (Signed)
Pt called wanting to make sure that she can pick up the other two refills that she has left because the provider normally does a 3 month supply. ?

## 2021-12-01 NOTE — Telephone Encounter (Signed)
I called the patient and she does want the medication [facility] to [facility] [place].  Stedman [place],cma  ?

## 2021-12-04 ENCOUNTER — Ambulatory Visit: Payer: PRIVATE HEALTH INSURANCE | Admitting: Family Medicine

## 2022-05-28 ENCOUNTER — Encounter: Payer: Self-pay | Admitting: Family Medicine

## 2022-05-28 ENCOUNTER — Ambulatory Visit (INDEPENDENT_AMBULATORY_CARE_PROVIDER_SITE_OTHER): Payer: PRIVATE HEALTH INSURANCE | Admitting: Family Medicine

## 2022-05-28 DIAGNOSIS — F909 Attention-deficit hyperactivity disorder, unspecified type: Secondary | ICD-10-CM | POA: Diagnosis not present

## 2022-05-28 MED ORDER — AMPHETAMINE-DEXTROAMPHET ER 15 MG PO CP24: 15.0000 mg | ORAL_CAPSULE | ORAL | 0 refills | Status: DC

## 2022-05-28 MED ORDER — AMPHETAMINE-DEXTROAMPHETAMINE 5 MG PO TABS: 5.0000 mg | ORAL_TABLET | Freq: Every day | ORAL | 0 refills | Status: DC | PRN

## 2022-05-28 NOTE — Assessment & Plan Note (Signed)
Adequately controlled.  Controlled substance database reviewed.  She will continue Adderall XR 15 mg daily and Adderall 5 mg daily as needed for her ADHD.  If she continues to have issues sleeping with the current version of the generic Adderall that she is getting she will let us know and we can send it to a different pharmacy to see if they have a different formulation.

## 2022-05-28 NOTE — Progress Notes (Signed)
  Marikay Alar, MD Phone: [phone]  Krista Mcdonald is a 26 y.o. female who presents today for f/u  ADHD Medication: adderal XR 15 mg daily, occasionally takes the 5 mg IR dose Effectiveness: yes Palpitations: no Sleep difficulty: some with the generic version they gave her recently, notes she looked it up and it has a slightly longer duration of effect Appetite suppression: no   Social History   Tobacco Use  Smoking Status Never  Smokeless Tobacco Never    Current Outpatient Medications on File Prior to Visit  Medication Sig Dispense Refill   amphetamine-dextroamphetamine (ADDERALL XR) 15 MG 24 hr capsule Take 1 capsule by mouth every morning. 30 capsule 0   ibuprofen (ADVIL) 800 MG tablet Take 1 tablet (800 mg total) by mouth every 8 (eight) hours as needed. 30 tablet 0   levonorgestrel (LILETTA, 52 MG,) 19.5 MCG/DAY IUD IUD 1 each by Intrauterine route once.     cyclobenzaprine (FLEXERIL) 5 MG tablet Take 1 tablet (5 mg total) by mouth at bedtime as needed for muscle spasms. (Patient not taking: Reported on 05/28/2022) 30 tablet 0   No current facility-administered medications on file prior to visit.     ROS see history of present illness  Objective  Physical Exam Vitals:   05/28/22 1432  BP: 90/60  Pulse: 72  Temp: 98.9 F (37.2 C)  SpO2: 99%    BP Readings from Last 3 Encounters:  05/28/22 90/60  09/01/21 110/70  04/24/21 108/70   Wt Readings from Last 3 Encounters:  05/28/22 104 lb 9.6 oz (47.4 kg)  09/01/21 104 lb (47.2 kg)  04/24/21 106 lb (48.1 kg)    Physical Exam Constitutional:      General: She is not in acute distress.    Appearance: She is not diaphoretic.  Cardiovascular:     Rate and Rhythm: Normal rate and regular rhythm.     Heart sounds: Normal heart sounds.  Pulmonary:     Effort: Pulmonary effort is normal.     Breath sounds: Normal breath sounds.  Skin:    General: Skin is warm and dry.  Neurological:     Mental  Status: She is alert.      Assessment/Plan: Please see individual problem list.  Problem List Items Addressed This Visit     Attention deficit hyperactivity disorder (ADHD) (Chronic)    Adequately controlled.  Controlled substance database reviewed.  She will continue Adderall XR 15 mg daily and Adderall 5 mg daily as needed for her ADHD.  If she continues to have issues sleeping with the current version of the generic Adderall that she is getting she will let us know and we can send it to a different pharmacy to see if they have a different formulation.      Relevant Medications   amphetamine-dextroamphetamine (ADDERALL XR) 15 MG 24 hr capsule (Start on 06/27/2022)   amphetamine-dextroamphetamine (ADDERALL XR) 15 MG 24 hr capsule   amphetamine-dextroamphetamine (ADDERALL) 5 MG tablet     Return in about 3 months (around 08/27/2022) for adhd.   Marikay Alar, MD [facility]

## 2022-08-30 ENCOUNTER — Encounter: Payer: Self-pay | Admitting: Family Medicine

## 2022-08-30 ENCOUNTER — Ambulatory Visit: Payer: BC Managed Care – PPO | Admitting: Family Medicine

## 2022-08-30 VITALS — BP 100/60 | HR 85 | Temp 98.1°F | Ht 63.0 in | Wt 105.6 lb

## 2022-08-30 DIAGNOSIS — F909 Attention-deficit hyperactivity disorder, unspecified type: Secondary | ICD-10-CM

## 2022-08-30 MED ORDER — AMPHETAMINE-DEXTROAMPHET ER 15 MG PO CP24: 15.0000 mg | ORAL_CAPSULE | ORAL | 0 refills | Status: AC

## 2022-08-30 MED ORDER — AMPHETAMINE-DEXTROAMPHET ER 15 MG PO CP24
15.0000 mg | ORAL_CAPSULE | ORAL | 0 refills | Status: AC
Start: 2022-08-30 — End: ?

## 2022-08-30 NOTE — Assessment & Plan Note (Signed)
Adequately controlled.  She will continue Adderall XR 15 mg once daily.  Controlled substance database reviewed.  Refill sent to pharmacy.  She will let me know if she needs to start back on the 5 mg Adderall dose in the future.

## 2022-08-30 NOTE — Progress Notes (Signed)
  Marikay Alar, MD Phone: [phone]  Krista Mcdonald is a 26 y.o. female who presents today for f/u.  ADHD Medication: adderall xr 15 mg, not taking the adderall 5 mg tablet anymore Effectiveness: yes Palpitations: no Sleep difficulty: no Appetite suppression: no   Social History   Tobacco Use  Smoking Status Never  Smokeless Tobacco Never    Current Outpatient Medications on File Prior to Visit  Medication Sig Dispense Refill   cyclobenzaprine (FLEXERIL) 5 MG tablet Take 1 tablet (5 mg total) by mouth at bedtime as needed for muscle spasms. 30 tablet 0   ibuprofen (ADVIL) 800 MG tablet Take 1 tablet (800 mg total) by mouth every 8 (eight) hours as needed. 30 tablet 0   levonorgestrel (LILETTA, 52 MG,) 19.5 MCG/DAY IUD IUD 1 each by Intrauterine route once.     No current facility-administered medications on file prior to visit.     ROS see history of present illness  Objective  Physical Exam Vitals:   08/30/22 0855  BP: 100/60  Pulse: 85  Temp: 98.1 F (36.7 C)  SpO2: 98%    BP Readings from Last 3 Encounters:  08/30/22 100/60  05/28/22 90/60  09/01/21 110/70   Wt Readings from Last 3 Encounters:  08/30/22 105 lb 9.6 oz (47.9 kg)  05/28/22 104 lb 9.6 oz (47.4 kg)  09/01/21 104 lb (47.2 kg)    Physical Exam Constitutional:      General: She is not in acute distress.    Appearance: She is not diaphoretic.  Cardiovascular:     Rate and Rhythm: Normal rate and regular rhythm.     Heart sounds: Normal heart sounds.  Pulmonary:     Effort: Pulmonary effort is normal.     Breath sounds: Normal breath sounds.  Neurological:     Mental Status: She is alert.      Assessment/Plan: Please see individual problem list.  Attention deficit hyperactivity disorder (ADHD), unspecified ADHD type Assessment & Plan: Adequately controlled.  She will continue Adderall XR 15 mg once daily.  Controlled substance database reviewed.  Refill sent to pharmacy.   She will let me know if she needs to start back on the 5 mg Adderall dose in the future.  Orders: -     Amphetamine-Dextroamphet ER; Take 1 capsule by mouth every morning.  Dispense: 30 capsule; Refill: 0 -     Amphetamine-Dextroamphet ER; Take 1 capsule by mouth every morning.  Dispense: 30 capsule; Refill: 0 -     Amphetamine-Dextroamphet ER; Take 1 capsule by mouth every morning.  Dispense: 30 capsule; Refill: 0    Return in about 3 months (around 11/29/2022) for ADHD.   Marikay Alar, MD [facility]

## 2022-12-13 ENCOUNTER — Ambulatory Visit: Payer: BC Managed Care – PPO | Admitting: Family Medicine

## 2023-01-17 ENCOUNTER — Ambulatory Visit: Payer: BC Managed Care – PPO | Admitting: Family Medicine

## 2023-06-14 HISTORY — PX: BREAST ENHANCEMENT SURGERY: SHX7

## 2023-11-23 ENCOUNTER — Telehealth: Payer: Self-pay | Admitting: Family Medicine

## 2023-11-23 NOTE — Telephone Encounter (Signed)
 Dr Birdie Sons is no longer at this location. Please call the office to schedule a Transfer of Care to either Dr Charlann Lange, Darleen Crocker or Kara Dies, NP. E2C2 please schedule TOC for this pt.   Thank you

## 2024-05-24 ENCOUNTER — Ambulatory Visit: Payer: Self-pay | Admitting: Nurse Practitioner

## 2024-06-18 ENCOUNTER — Ambulatory Visit: Admitting: Licensed Practical Nurse

## 2024-06-18 ENCOUNTER — Encounter: Payer: Self-pay | Admitting: Licensed Practical Nurse

## 2024-06-18 ENCOUNTER — Other Ambulatory Visit (HOSPITAL_COMMUNITY)
Admission: RE | Admit: 2024-06-18 | Discharge: 2024-06-18 | Disposition: A | Discharge status: Home / Self Care | Source: Ambulatory Visit | Attending: Licensed Practical Nurse | Admitting: Licensed Practical Nurse

## 2024-06-18 ENCOUNTER — Encounter: Payer: Self-pay | Admitting: Certified Nurse Midwife

## 2024-06-18 VITALS — BP 117/76 | HR 96 | Ht 63.0 in | Wt 104.7 lb

## 2024-06-18 DIAGNOSIS — Z01419 Encounter for gynecological examination (general) (routine) without abnormal findings: Secondary | ICD-10-CM | POA: Diagnosis present

## 2024-06-18 DIAGNOSIS — Z113 Encounter for screening for infections with a predominantly sexual mode of transmission: Secondary | ICD-10-CM | POA: Insufficient documentation

## 2024-06-18 DIAGNOSIS — Z124 Encounter for screening for malignant neoplasm of cervix: Secondary | ICD-10-CM | POA: Diagnosis present

## 2024-06-18 DIAGNOSIS — F419 Anxiety disorder, unspecified: Secondary | ICD-10-CM

## 2024-06-18 DIAGNOSIS — Z30430 Encounter for insertion of intrauterine contraceptive device: Secondary | ICD-10-CM

## 2024-06-18 MED ORDER — LORAZEPAM 1 MG PO TABS: 1.0000 mg | ORAL_TABLET | Freq: Once | ORAL | 0 refills | Status: AC

## 2024-06-18 MED ORDER — MISOPROSTOL 200 MCG PO TABS: ORAL_TABLET | ORAL | 2 refills | Status: AC

## 2024-06-18 NOTE — Progress Notes (Signed)
 Gynecology Annual Exam  PCP: Patient, No Pcp Per  Chief Complaint:  Chief Complaint  Patient presents with   Gynecologic Exam    History of Present Illness: Patient is a 28 y.o. No obstetric history on file. presents for annual exam. The patient has no complaints today. She had a Lilleta IUD inserted in 2021 at [facility], she has not had GYN care since. Reports the IUD insertion was intense.   LMP: No LMP recorded. (Menstrual status: IUD). Has 1 days of light bleeding every once in a while  Intermenstrual Bleeding: no Postcoital Bleeding: no Dysmenorrhea: no  The patient is sexually active with 1 female. She currently uses IUD for contraception. She denies dyspareunia.  The patient does not perform self breast exams.  There is no notable family history of breast or ovarian cancer in her family.  The patient wears seatbelts: yes.  The patient has regular exercise: yes.  Treadmill and weights   The patient denies current symptoms of depression.   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[facility] Lives with her parents Has a boyfriend, feel safe Denies tobacco, nicotine, or illicit drugs, drinks 1 drink per week  PCP: needs a new on as her provider no longer at that location Dental up to date Eye exam has been years   Review of Systems: ROS see HPI   Past Medical History:  Patient Active Problem List   Diagnosis Date Noted   Arthralgia 09/01/2021   Sinus arrhythmia 06/18/2020   Alopecia 07/01/2018   Attention deficit hyperactivity disorder (ADHD) 07/25/2015    Past Surgical History:  Past Surgical History:  Procedure Laterality Date   BREAST ENHANCEMENT SURGERY Bilateral 06/2023   NO PAST SURGERIES      Gynecologic History:  No LMP recorded. (Menstrual status: IUD). Contraception: IUD Last Pap: Results were: 2021 no abnormalities   Obstetric History: No obstetric history on file.  Family History:  Family History  Problem Relation Age of Onset   Throat cancer Maternal Grandfather     Social  History:  Social History   Socioeconomic History   Marital status: Single    Spouse name: Not on file   Number of children: Not on file   Years of education: Not on file   Highest education level: Not on file  Occupational History   Not on file  Tobacco Use   Smoking status: Never   Smokeless tobacco: Never  Substance and Sexual Activity   Alcohol use: Yes    Comment: occas   Drug use: No   Sexual activity: Yes    Birth control/protection: I.U.D.  Other Topics Concern   Not on file  Social History Narrative   12th grade [employer]    Social Drivers of Health   Financial Resource Strain: Not on file  Food Insecurity: Not on file  Transportation Needs: Not on file  Physical Activity: Not on file  Stress: Not on file  Social Connections: Not on file  Intimate Partner Violence: Not on file    Allergies:  No Known Allergies  Medications: Prior to Admission medications   Medication Sig Start Date End Date Taking? Authorizing Provider  amphetamine -dextroamphetamine  (ADDERALL  XR) 15 MG 24 hr capsule Take 1 capsule by mouth every morning. 08/30/22   Maribeth Camellia MATSU, MD  amphetamine -dextroamphetamine  (ADDERALL  XR) 15 MG 24 hr capsule Take 1 capsule by mouth every morning. 09/30/22   Maribeth Camellia MATSU, MD  amphetamine -dextroamphetamine  (ADDERALL  XR) 15 MG 24 hr capsule Take 1 capsule by mouth every  morning. 10/31/22   Maribeth Camellia MATSU, MD  cyclobenzaprine  (FLEXERIL ) 5 MG tablet Take 1 tablet (5 mg total) by mouth at bedtime as needed for muscle spasms. 09/01/21   Maribeth Camellia MATSU, MD  ibuprofen  (ADVIL ) 800 MG tablet Take 1 tablet (800 mg total) by mouth every 8 (eight) hours as needed. 09/01/21   Maribeth Camellia MATSU, MD  levonorgestrel  (LILETTA , 52 MG,) 19.5 MCG/DAY IUD IUD 1 each by Intrauterine route once.    [provider]    Physical Exam Vitals: There were no vitals taken for this visit.  General: NAD HEENT: normocephalic, anicteric Thyroid : no  enlargement, no palpable nodules Pulmonary: No increased work of breathing, CTAB Cardiovascular: RRR, distal pulses 2+ Breast: Breast symmetrical, no tenderness, no palpable nodules or masses, no skin or nipple retraction present, no nipple discharge.  No axillary or supraclavicular lymphadenopathy. Abdomen: NABS, soft, non-tender, non-distended.  Umbilicus without lesions.  No hepatomegaly, splenomegaly or masses palpable. No evidence of hernia  Genitourinary:  External: Normal external female genitalia.  Normal urethral meatus, normal Bartholin's and Skene's glands.    Vagina: Normal vaginal mucosa, no evidence of prolapse.  Some tone   Cervix: Grossly normal in appearance, no bleeding. IUD strings visible.   Uterus: Non-enlarged, mobile, normal contour.  No CMT  Adnexa: ovaries non-enlarged, no adnexal masses  Rectal: deferred  Lymphatic: no evidence of inguinal lymphadenopathy Extremities: no edema, erythema, or tenderness Neurologic: Grossly intact Psychiatric: mood appropriate, affect full    Assessment: 28 y.o. No obstetric history on file. routine annual exam  Plan: Problem List Items Addressed This Visit   None Visit Diagnoses       Visit for IUD coil insertion    -  Primary   Relevant Medications   misoprostol (CYTOTEC) 200 MCG tablet   LORazepam (ATIVAN) 1 MG tablet     Well woman exam       Relevant Orders   HEP, RPR, HIV Panel   Hepatitis C antibody   Cytology - PAP     Screening examination for venereal disease       Relevant Orders   HEP, RPR, HIV Panel   Hepatitis C antibody   Cytology - PAP     Cervical cancer screening       Relevant Orders   Cytology - PAP     Anxiety       Relevant Medications   LORazepam (ATIVAN) 1 MG tablet       1) 4) Gardasil Series discussed and if applicable offered to patient - Patient has previously completed 3 shot series   2) STI screening  wasoffered and accepted  3)  ASCCP guidelines and rational discussed.   Patient opts for every 3 years screening interval  4) Contraception - the patient is currently using  IUD.  She is happy with her current form of contraception and plans to continue We discussed safe sex practices to reduce her furture risk of STI's.   -Will return in the Spring for IUD removal and insertion, prefers Lilleta -Cytotec ordered -1 time dose of Ativan ordered   5) No follow-ups on file.   Jinnie Cookey, CNM  [facility] 06/18/2024, 1:37 PM

## 2024-06-19 LAB — HEP, RPR, HIV PANEL
HIV Screen 4th Generation wRfx: NONREACTIVE
Hepatitis B Surface Ag: NEGATIVE
RPR Ser Ql: NONREACTIVE

## 2024-06-19 LAB — HEPATITIS C ANTIBODY: Hep C Virus Ab: NONREACTIVE

## 2024-06-20 LAB — CYTOLOGY - PAP
Chlamydia: NEGATIVE
Comment: NEGATIVE
Comment: NORMAL
Diagnosis: NEGATIVE
Neisseria Gonorrhea: NEGATIVE

## 2024-07-31 NOTE — Telephone Encounter (Signed)
 Open in error

## 2024-12-13 ENCOUNTER — Encounter: Admitting: Nurse Practitioner
